# Patient Record
Sex: Female | Born: 1965 | Race: White | Hispanic: No | Marital: Married | Smoking: Former smoker
Health system: Southern US, Community
[De-identification: ages and names within clinical notes are randomized; demographics above are authoritative.]

## PROBLEM LIST (undated history)

## (undated) DIAGNOSIS — G43909 Migraine, unspecified, not intractable, without status migrainosus: Secondary | ICD-10-CM

## (undated) DIAGNOSIS — K635 Polyp of colon: Secondary | ICD-10-CM

## (undated) DIAGNOSIS — C4490 Unspecified malignant neoplasm of skin, unspecified: Secondary | ICD-10-CM

## (undated) DIAGNOSIS — B019 Varicella without complication: Secondary | ICD-10-CM

## (undated) DIAGNOSIS — M1990 Unspecified osteoarthritis, unspecified site: Secondary | ICD-10-CM

## (undated) DIAGNOSIS — N390 Urinary tract infection, site not specified: Secondary | ICD-10-CM

## (undated) DIAGNOSIS — K5792 Diverticulitis of intestine, part unspecified, without perforation or abscess without bleeding: Secondary | ICD-10-CM

## (undated) HISTORY — DX: Urinary tract infection, site not specified: N39.0

## (undated) HISTORY — DX: Unspecified osteoarthritis, unspecified site: M19.90

## (undated) HISTORY — DX: Migraine, unspecified, not intractable, without status migrainosus: G43.909

## (undated) HISTORY — DX: Polyp of colon: K63.5

## (undated) HISTORY — DX: Diverticulitis of intestine, part unspecified, without perforation or abscess without bleeding: K57.92

## (undated) HISTORY — PX: TONSILLECTOMY: SUR1361

## (undated) HISTORY — DX: Varicella without complication: B01.9

## (undated) HISTORY — DX: Unspecified malignant neoplasm of skin, unspecified: C44.90

---
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
## 1998-01-04 ENCOUNTER — Inpatient Hospital Stay (HOSPITAL_COMMUNITY): Admission: AD | Admit: 1998-01-04 | Discharge: 1998-01-08 | Payer: Self-pay | Admitting: Obstetrics and Gynecology

## 2001-07-16 ENCOUNTER — Other Ambulatory Visit: Admission: RE | Admit: 2001-07-16 | Discharge: 2001-07-16 | Payer: Self-pay | Admitting: Obstetrics and Gynecology

## 2002-07-11 ENCOUNTER — Other Ambulatory Visit: Admission: RE | Admit: 2002-07-11 | Discharge: 2002-07-11 | Payer: Self-pay | Admitting: Obstetrics and Gynecology

## 2003-06-13 ENCOUNTER — Ambulatory Visit (HOSPITAL_COMMUNITY): Admission: RE | Admit: 2003-06-13 | Discharge: 2003-06-13 | Payer: Self-pay | Admitting: Internal Medicine

## 2003-08-15 ENCOUNTER — Other Ambulatory Visit: Admission: RE | Admit: 2003-08-15 | Discharge: 2003-08-15 | Payer: Self-pay | Admitting: Obstetrics and Gynecology

## 2003-08-27 ENCOUNTER — Ambulatory Visit (HOSPITAL_COMMUNITY): Admission: RE | Admit: 2003-08-27 | Discharge: 2003-08-27 | Payer: Self-pay | Admitting: Obstetrics and Gynecology

## 2004-10-27 ENCOUNTER — Other Ambulatory Visit: Admission: RE | Admit: 2004-10-27 | Discharge: 2004-10-27 | Payer: Self-pay | Admitting: Obstetrics and Gynecology

## 2005-08-17 ENCOUNTER — Inpatient Hospital Stay (HOSPITAL_COMMUNITY): Admission: RE | Admit: 2005-08-17 | Discharge: 2005-08-20 | Payer: Self-pay | Admitting: Obstetrics and Gynecology

## 2005-08-17 ENCOUNTER — Encounter (INDEPENDENT_AMBULATORY_CARE_PROVIDER_SITE_OTHER): Payer: Self-pay | Admitting: Specialist

## 2005-09-16 ENCOUNTER — Other Ambulatory Visit: Admission: RE | Admit: 2005-09-16 | Discharge: 2005-09-16 | Payer: Self-pay | Admitting: Obstetrics and Gynecology

## 2006-10-02 ENCOUNTER — Emergency Department (HOSPITAL_COMMUNITY): Admission: EM | Admit: 2006-10-02 | Discharge: 2006-10-02 | Payer: Self-pay | Admitting: Emergency Medicine

## 2008-12-27 ENCOUNTER — Encounter: Admission: RE | Admit: 2008-12-27 | Discharge: 2008-12-27 | Payer: Self-pay | Admitting: Internal Medicine

## 2011-02-25 NOTE — Discharge Summary (Signed)
Janet George, [address]               ACCOUNT NO.:  [id number]   MEDICAL RECORD NO.:  [id number]          PATIENT TYPE:  INP   LOCATION:  9145                          FACILITY:  [facility]   PHYSICIAN:  Michelle L. Grewal, M.D.DATE OF BIRTH:  Jun 14, 1966   DATE OF ADMISSION:  08/17/2005  DATE OF DISCHARGE:  08/20/2005                                 DISCHARGE SUMMARY   ADMITTING DIAGNOSIS:  1.  Intrauterine pregnancy at term.  2.  Previous cesarean section, desires repeat.  3.  Multiparity, desires sterility.   DISCHARGE DIAGNOSES:  1.  Status post low transverse cesarean section.  2.  Viable female infant.  3.  Permanent sterilization.   PROCEDURES:  1.  Repeat low transverse cesarean section.  2.  Bilateral tubal ligation.   REASON FOR ADMISSION:  Please see dictated H&P.   HOSPITAL COURSE:  The patient is a 45 year old gravida 3, para 2, married  female that was admitted to [facility] for a scheduled  cesarean section.  The patient had had a previous cesarean with her previous  delivery and desired repeat.  Due to multiparity, the patient also requested  permanent sterilization.  On the morning of admission, the patient was taken  to the operating room where spinal anesthesia was administered without  difficulty.  A low transverse incision was made with delivery of a viable  female infant, weighing 7 pounds 15 ounces, with Apgar's of 9 at 1 minute and  9 at 5 minutes. The patient tolerated the procedure well and was taken to  the recovery room in stable condition.  On postoperative day #1, the patient was without complaint.  Vital signs  stable.  She was afebrile.  Abdomen soft with good return of bowel function.  Fundus was firm and nontender.  Abdominal dressing was noted to have a small  amount of bright red bleeding noted on bandage.  The bandage was partially  removed with small oozing noted left of midline.  A Foley had been  discontinued and the patient was  voiding adequately.  Laboratory findings  revealed hemoglobin of 11.0, platelet count of 158,000, WBC count of 11.8.  On postoperative day #2, the patient was without complaint.  Vital signs  remained stable.  Fundus was firm and nontender.  Abdominal dressing had  been removed revealing an incision that was now clean, dry, and intact.  The  patient was ambulating well, tolerating a regular diet without complaints of  nausea vomiting.  On postoperative day #3, the patient was without complaint.  Vital signs  remained stable.  Fundus firm and nontender.  Incision was clean, dry, and  intact, staples removed.  The patient was discharged home   CONDITION ON DISCHARGE:  Good.   DIET:  Regular as tolerated.   ACTIVITY:  No heavy lifting, no driving x2 weeks, no vaginal entry.   FOLLOWUP:  Patient to follow up in the office in 1-2 weeks for incision  check.  She is to call for temperature greater than 100 degrees, persistent  nausea, vomiting, heavy vaginal bleeding, and/or redness or drainage from  incisional  site.   DISCHARGE MEDICATIONS:  1.  Tylox, #30, one p.o. every four to six hours p.r.n.  2.  Motrin 600 mg every 6 hours.  3.  Prenatal vitamins one p.o. daily.  4.  Colace one p.o. daily p.r.n.      Julio Sicks, N.P.      Stann Mainland. Vincente Poli, M.D.  Electronically Signed    CC/MEDQ  D:  09/20/2005  T:  09/20/2005  Job:  [id number]

## 2011-02-25 NOTE — Op Note (Signed)
NAME:  Janet George, Janet George                           ACCOUNT NO.:  [id number]   MEDICAL RECORD NO.:  [id number]                   PATIENT TYPE:  AMB   LOCATION:  SDC                                  [facility]ILITY:  [facility]   PHYSICIAN:  Juluis Mire, M.D.                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   OPERATIVE PROCEDURE:  Laser ablation of condyloma.   SURGEON:  Juluis Mire, M.D.   ANESTHESIA:  General endotracheal.   ESTIMATED BLOOD LOSS:  Minimal.   PACKS AND DRAINS:  None.   INTRAOPERATIVE BLOOD REPLACED:  None.   COMPLICATIONS:  None.   INDICATIONS:  Noted in the history and physical.   The procedure was as follows:  The patient was taken to the OR, placed in  the supine position.  After a satisfactory level of general anesthesia  obtained, the patient was placed in the dorsal lithotomy position using the  Allen stirrups.  The perineum was cleansed with vinegar.  The CO2 laser and  magnification device were brought into place.  Using the CO2 laser at 15  watts continuous mode, areas of active vulvar condyloma were ablated.  We  then defocused the laser and blanched the skin around the lesions, hopefully  to prevent recurrences.  We applied Xylocaine jelly for comfort.  The  patient was taken out of the dorsal lithotomy position and once alert and  extubated, transferred to the recovery room in good condition.  Sponge,  instrument, and needle count reported as correct by the circulating nurse.                                               Juluis Mire, M.D.    JSM/MEDQ  D:  08/27/2003  T:  08/27/2003  Job:  [id number]

## 2011-02-25 NOTE — H&P (Signed)
NAME:  Janet George, Janet George NO.:  [id number]   MEDICAL RECORD NO.:  [id number]          PATIENT TYPE:  INP   LOCATION:  NA                            [facility]   PHYSICIAN:  Juluis Mire, M.D.   DATE OF BIRTH:  [birth year]   DATE OF ADMISSION:  08/17/2005  DATE OF DISCHARGE:                                HISTORY & PHYSICAL   HISTORY OF PRESENT ILLNESS:  The patient is a 45 year old, gravida 3, para 2  married female, estimated date of confinement of August 25, 2005; this  gives her an estimated gestational age of [redacted] weeks.  This is consistent with  initial examination and early ultrasounds.  Her prenatal course has been  complicated by advanced maternal age.  She declined amniocentesis.  She did  undergo a first trimester screening with the [employer] of  [place].  This was negative.  Follow up second trimester ultrasound was  also normal.  She has had 2 prior cesarean sections.  She now presents for  repeat cesarean section, and desires permanent sterilization, and bilateral  tubal ligation will be performed.   ALLERGIES:  No known drug allergies.   MEDICATIONS:  Prenatal vitamins.   PAST MEDICAL HISTORY/FAMILY HISTORY/SOCIAL HISTORY:  Please see prenatal  records.   REVIEW OF SYSTEMS:  Noncontributory.   PHYSICAL EXAMINATION:  VITAL SIGNS:  The patient is afebrile with stable  vital signs.  HEENT:  The patient is normocephalic.  Pupils equal, round and reactive to  light and accommodation.  Extraocular movements were intact.  Sclerae and  conjunctivae clear.  Oropharynx clear.  NECK:  Without thyromegaly.  BREASTS:  Glandular, but no discrete masses.  LUNGS:  Clear.  CARDIOVASCULAR:  Regular rhythm and rate with a grade 2/6 systolic ejection  murmur.  No clicks or gallops.  ABDOMEN:  Gravid uterus consistent with dates.  Well-healed low transverse  incision.  PELVIC:  Deferred.  EXTREMITIES:  Trace edema.  NEUROLOGIC:  Grossly within  normal limits.  Deep tendon reflexes were 2+  without clonus.   IMPRESSION:  1.  Intrauterine pregnancy at term with prior cesarean section for repeat.  2.  Multiparity; desires sterility.   PLAN:  The patient will undergo repeat cesarean section with bilateral tubal  ligation.  The risks of surgery have been discussed including the risks of  infection, the risk of hemorrhage that could require transfusion, with the  risk of AIDS or hepatitis, the risk of injury to adjacent organs involving  bladder, bowel, or ureters that could require further exploratory surgery,  risk of deep vein thrombosis and pulmonary embolus.  In terms of the tubal,  alternative forms  of birth control have been discussed.  The potential irreversibility of  sterilization was explained.  A failure rate of 1 in 200 was quoted.  Failures can be in the form of ectopic pregnancy requiring further surgical  management.  The patient does understand indications and risks.      Juluis Mire, M.D.  Electronically Signed     JSM/MEDQ  D:  08/17/2005  T:  08/17/2005  Job:  [id number]

## 2011-02-25 NOTE — H&P (Signed)
NAME:  Janet George, Janet George                           ACCOUNT NO.:  [id number]   MEDICAL RECORD NO.:  [id number]                   PATIENT TYPE:  AMB   LOCATION:  SDC                                  [facility]   PHYSICIAN:  Juluis Mire, M.D.                DATE OF BIRTH:  [birth year]   DATE OF ADMISSION:  08/27/2003  DATE OF DISCHARGE:                                HISTORY & PHYSICAL   BRIEF HISTORY:  Patient a 45 year old gravida 2, para 2 white female  presents for laser ablation of all of her HPV.   In relation to the present admission, for the past month, we have been  treating a very resistant case of vulvar HPV.  We have tried topical  trichloroacetic acid.  We have tried Aldara at home and cryotherapy in the  office, despite this she has progressive and persistent HPV.  We are going  to proceed with laser ablation of the vulvar condyloma.   ALLERGIES:  She has no known drug allergies.   MEDICATIONS:  Medications include birth control pills.   PAST MEDICAL HISTORY:  Usual childhood diseases without any significant  sequela.   PREVIOUS OBSTETRICAL HISTORY:  She has had two prior cesarean sections.   OTHER SURGICAL HISTORY:  She has had one previous diagnostic laparoscopy.   FAMILY HISTORY:  Noncontributory.   SOCIAL HISTORY:  No tobacco or alcohol.   REVIEW OF SYSTEMS:  Noncontributory.   PHYSICAL EXAMINATION:  VITAL SIGNS:  Patient is afebrile with stable vital  signs.  HEENT:  Patient normocephalic.  Pupils equal, round and reactive to light  and accommodation; extraocular movements are intact; sclerae and  conjunctivae are clear.  Oropharynx clear.  NECK:  Without thyromegaly.  BREASTS:  No discrete masses.  LUNGS:  Clear.  CARDIOVASCULAR SYSTEM:  Regular rhythm and rate.  No murmurs or gallops.  ABDOMEN:  Exam is benign.  No mass, organomegaly, or tenderness.  PELVIC:  Active vulvar HPV are noted on the perineal body, perirectal area,  periclitoral area.   Vaginal mucosa clear.  Cervix unremarkable.  Uterus  normal size, shape, and contour.  Adnexa free of masses or tenderness.  EXTREMITIES:  Trace edema.  NEUROLOGICAL:  Exam is grossly within normal limits.   IMPRESSION:  Resistant vulvar human papilloma virus.   PLAN:  Patient to undergo laser ablation of all of her HPV.  The risks of  surgery have been discussed including the risk of infection.  The risk of  bleeding.  Risk of injury to adjacent organs.  Risk of deep venous  thrombosis or pulmonary embolus.  Patient expressed understanding of  indication and risks.  Juluis Mire, M.D.    JSM/MEDQ  D:  08/27/2003  T:  08/27/2003  Job:  [id number]

## 2011-02-25 NOTE — Op Note (Signed)
Janet George, [place]               ACCOUNT NO.:  [id number]   MEDICAL RECORD NO.:  [id number]          PATIENT TYPE:  INP   LOCATION:  9145                          [facility]   PHYSICIAN:  Juluis Mire, M.D.   DATE OF BIRTH:  08-Sep-1966   DATE OF PROCEDURE:  08/17/2005  DATE OF DISCHARGE:                                 OPERATIVE REPORT   PREOPERATIVE DIAGNOSES:  1.  Intrauterine pregnancy at term with prior cesarean section for repeat.      2. Multiparity, desires sterility.   POSTOPERATIVE DIAGNOSES:  1.  Intrauterine pregnancy at term with prior cesarean section for repeat.      2. Multiparity, desires sterility.   OPERATIVE PROCEDURE:  Low transverse cesarean section with bilateral tubal  ligation.   SURGEON:  Juluis Mire, M.D.   ANESTHESIA:  Spinal.   ESTIMATED BLOOD LOSS:  500 mL.   PACKS AND DRAINS:  None.   INTRAOPERATIVE BLOOD REPLACEMENT:  None.   COMPLICATIONS:  None.   INDICATION:  As dictated in History and Physical.   DESCRIPTION OF PROCEDURE:  The patient was taken to the operating room and  placed in the supine position with left lateral tilt.  After a satisfactory  level of spinal anesthesia was obtained, the low transverse incision that  was previously made was excised.  The incision was then extended through the  subcutaneous tissue.  The fascia was identified and entered sharply and the  incision in the fascia was extended laterally.  The fascia was then taken  off the muscles superiorly and inferiorly.  Rectus muscles were separated in  the midline.  Perineum was entered sharply and the incision in the perineum  was extended both superiorly and inferiorly.  A low transverse bladder flap  was developed.  A low transverse uterine incision was begun with a knife and  extended laterally using manual traction.  Amniotic fluid was clear.  The  infant was delivered with fundal pressure and the vacuum extractor.  The  infant was a viable female  weighing 7 pounds 15 ounces, Apgars 9/9.  Umbilical  cord pH is still pending.  The placenta was delivered.  Uterus was then  exteriorized for closure.  The uterus was closed with interrupted suture of  0 chromic using two-layer closure technique with excellent hemostasis.  Urine output remained clear and adequate.   Both tubes were identified and elevated with the Babcock tenaculum.  A hole  was made in the avascular mesosalpinx.  Individual sutures of 0 plain catgut  were used to ligate off the segment of tube.  The intervening segment of  tube was then excised.  The cut ends of the tube were cauterized using the  Bovie.  We had good hemostasis bilaterally.  Both tubes were adequately  transected.  Ovaries appeared normal.  Uterus was returned to the abdominal  cavity.  Abdomen was thoroughly irrigated.  We had good hemostasis and clear  urine output.  Muscles were reapproximated with a running suture of 3-0  Vicryl.  Fascia was closed with a running suture of 0  PDS.  Skin was closed with staples and Steri-Strips.  Sponge, instrument,  and needle count were reported as correct by the circulating nurse x2.  Foley catheter remained clear at the time of closure.  The patient tolerated  the procedure well and was returned to the recovery room in good condition.      Juluis Mire, M.D.  Electronically Signed     JSM/MEDQ  D:  08/17/2005  T:  08/17/2005  Job:  [id number]

## 2013-07-15 ENCOUNTER — Other Ambulatory Visit: Payer: Self-pay | Admitting: Gastroenterology

## 2015-04-14 ENCOUNTER — Other Ambulatory Visit: Payer: Self-pay | Admitting: Obstetrics and Gynecology

## 2015-04-15 LAB — CYTOLOGY - PAP

## 2016-01-28 ENCOUNTER — Emergency Department (HOSPITAL_BASED_OUTPATIENT_CLINIC_OR_DEPARTMENT_OTHER): Payer: BLUE CROSS/BLUE SHIELD

## 2016-01-28 ENCOUNTER — Emergency Department (HOSPITAL_BASED_OUTPATIENT_CLINIC_OR_DEPARTMENT_OTHER)
Admission: EM | Admit: 2016-01-28 | Discharge: 2016-01-28 | Disposition: A | Discharge status: Home / Self Care | Payer: BLUE CROSS/BLUE SHIELD | Attending: Emergency Medicine | Admitting: Emergency Medicine

## 2016-01-28 ENCOUNTER — Encounter (HOSPITAL_BASED_OUTPATIENT_CLINIC_OR_DEPARTMENT_OTHER): Payer: Self-pay | Admitting: *Deleted

## 2016-01-28 DIAGNOSIS — R079 Chest pain, unspecified: Secondary | ICD-10-CM | POA: Insufficient documentation

## 2016-01-28 DIAGNOSIS — R197 Diarrhea, unspecified: Secondary | ICD-10-CM | POA: Diagnosis not present

## 2016-01-28 DIAGNOSIS — M549 Dorsalgia, unspecified: Secondary | ICD-10-CM | POA: Diagnosis not present

## 2016-01-28 DIAGNOSIS — R42 Dizziness and giddiness: Secondary | ICD-10-CM | POA: Diagnosis not present

## 2016-01-28 DIAGNOSIS — R110 Nausea: Secondary | ICD-10-CM | POA: Insufficient documentation

## 2016-01-28 DIAGNOSIS — R0602 Shortness of breath: Secondary | ICD-10-CM | POA: Insufficient documentation

## 2016-01-28 LAB — COMPREHENSIVE METABOLIC PANEL
ALBUMIN: 4.1 g/dL (ref 3.5–5.0)
ALT: 15 U/L (ref 14–54)
AST: 19 U/L (ref 15–41)
Alkaline Phosphatase: 73 U/L (ref 38–126)
Anion gap: 7 (ref 5–15)
BUN: 24 mg/dL — AB (ref 6–20)
CHLORIDE: 102 mmol/L (ref 101–111)
CO2: 27 mmol/L (ref 22–32)
CREATININE: 0.77 mg/dL (ref 0.44–1.00)
Calcium: 9.2 mg/dL (ref 8.9–10.3)
GFR calc Af Amer: >60 mL/min (ref >60)
GFR calc non Af Amer: >60 mL/min (ref >60)
GLUCOSE: 90 mg/dL (ref 65–99)
POTASSIUM: 3.7 mmol/L (ref 3.5–5.1)
SODIUM: 136 mmol/L (ref 135–145)
Total Bilirubin: 0.6 mg/dL (ref 0.3–1.2)
Total Protein: 7.4 g/dL (ref 6.5–8.1)

## 2016-01-28 LAB — CBC WITH DIFFERENTIAL/PLATELET
Basophils Absolute: 0.1 10*3/uL (ref 0.0–0.1)
Basophils Relative: 1 %
EOS ABS: 0.2 10*3/uL (ref 0.0–0.7)
EOS PCT: 3 %
HCT: 38.8 % (ref 36.0–46.0)
Hemoglobin: 12.8 g/dL (ref 12.0–15.0)
LYMPHS ABS: 2.6 10*3/uL (ref 0.7–4.0)
LYMPHS PCT: 33 %
MCH: 29.6 pg (ref 26.0–34.0)
MCHC: 33 g/dL (ref 30.0–36.0)
MCV: 89.8 fL (ref 78.0–100.0)
MONOS PCT: 11 %
Monocytes Absolute: 0.9 10*3/uL (ref 0.1–1.0)
Neutro Abs: 4.2 10*3/uL (ref 1.7–7.7)
Neutrophils Relative %: 52 %
PLATELETS: 266 10*3/uL (ref 150–400)
RBC: 4.32 MIL/uL (ref 3.87–5.11)
RDW: 13.7 % (ref 11.5–15.5)
WBC: 8 10*3/uL (ref 4.0–10.5)

## 2016-01-28 LAB — TROPONIN I
Troponin I: <0.03 ng/mL (ref <0.031)
Troponin I: <0.03 ng/mL (ref <0.031)

## 2016-01-28 MED ORDER — SODIUM CHLORIDE 0.9 % IV SOLN
INTRAVENOUS | Status: DC
  Administered 2016-01-28: 750 mL via INTRAVENOUS

## 2016-01-28 MED ORDER — NITROGLYCERIN 0.4 MG SL SUBL
0.4000 mg | SUBLINGUAL_TABLET | SUBLINGUAL | Status: DC | PRN
  Administered 2016-01-28 (×3): 0.4 mg via SUBLINGUAL
  Filled 2016-01-28: qty 1

## 2016-01-28 MED ORDER — SODIUM CHLORIDE 0.9 % IV BOLUS (SEPSIS)
250.0000 mL | Freq: Once | INTRAVENOUS | Status: AC
  Administered 2016-01-28: 250 mL via INTRAVENOUS

## 2016-01-28 NOTE — ED Notes (Signed)
Chest pain in her upper chest x 2 weeks. Sharp pain and lightheaded while driving home today. Right arm numbness.

## 2016-01-28 NOTE — ED Provider Notes (Addendum)
CSN: [id number]     Arrival date & time 01/28/16  1842 History   By signing my name below, I, Janet George, attest that this documentation has been prepared under the direction and in the presence of Fredia Sorrow, MD.  Electronically Signed: Forrestine George, ED Scribe. 01/28/2016. 7:43 PM.   Chief Complaint  Patient presents with  . Chest Pain   HPI  HPI Comments: Janet George is a 50 y.o. female without any pertinent past medical history who presents to the Emergency Department complaining of intermittent, ongoing chest pain x 2 weeks; worsened today with radiating pain to the back. Episodes last for a few hours when they come; Today's episode initially started at 2:00 PM and is still ongoing. Pt described pain as achy and intermittently sharp. Currently discomfort is rated 5/10. She also reports mild back pain, shortness of breath, lightheadedness, and nausea. No aggravating or alleviating factors attempted prior to arrival. Full dose of ASA attempted at home prior to arrival. No recent fever, chills, cough, vomiting, or abdominal pain. She denies any prior history of same. No family history of heart disease. No known allergies to medications.  PCP: Helane Rima, MD    History reviewed. No pertinent past medical history. Past Surgical History  Procedure Laterality Date  . Tonsillectomy    . Cesarean section     No family history on file. Social History  Substance Use Topics  . Smoking status: Never Smoker   . Smokeless tobacco: None  . Alcohol Use: Yes     Comment: weekends    OB History    No data available     Review of Systems  Constitutional: Negative for fever and chills.  HENT: Negative for rhinorrhea and sore throat.   Eyes: Negative for visual disturbance.  Respiratory: Positive for shortness of breath. Negative for cough.   Cardiovascular: Positive for chest pain. Negative for leg swelling.  Gastrointestinal: Positive for nausea and diarrhea. Negative for  vomiting and abdominal pain.  Genitourinary: Negative for dysuria.  Musculoskeletal: Positive for back pain. Negative for neck pain.  Skin: Negative for rash.  Neurological: Positive for light-headedness. Negative for dizziness and headaches.  Hematological: Does not bruise/bleed easily.  Psychiatric/Behavioral: Negative for confusion.      Allergies  Review of patient's allergies indicates no known allergies.  Home Medications   Prior to Admission medications   Not on File   Triage Vitals: BP 104/71 mmHg  Pulse 68  Temp(Src) 98.3 F (36.8 C) (Oral)  Resp 13  Ht 5\' 5"  (1.651 m)  Wt 83.915 kg  BMI 30.79 kg/m2  SpO2 100%   Physical Exam  Constitutional: She is oriented to person, place, and time. She appears well-developed and well-nourished. No distress.  HENT:  Head: Normocephalic and atraumatic.  Mouth/Throat: Oropharynx is clear and moist. No oropharyngeal exudate.  Eyes: EOM are normal. Pupils are equal, round, and reactive to light.  Neck: Normal range of motion.  Cardiovascular: Normal rate, regular rhythm and normal heart sounds.   Capillary refill to both toes are 1 second  Pulmonary/Chest: Effort normal and breath sounds normal.  Abdominal: Soft. She exhibits no distension. There is no tenderness.  Musculoskeletal: Normal range of motion. She exhibits no edema.  Neurological: She is alert and oriented to person, place, and time. No cranial nerve deficit. She exhibits normal muscle tone. Coordination normal.  Skin: Skin is warm and dry.  Psychiatric: She has a normal mood and affect. Judgment normal.  Nursing note and  vitals reviewed.   ED Course  Procedures (including critical care time)  DIAGNOSTIC STUDIES: Oxygen Saturation is 100% on RA, Normal by my interpretation.    COORDINATION OF CARE: 7:26 PM- Will order CXR, blood work, and EKG. Will give Nitro and fluids. Discussed treatment plan with pt at bedside and pt agreed to plan.     Labs Review Labs  Reviewed  COMPREHENSIVE METABOLIC PANEL - Abnormal; Notable for the following:    BUN 24 (*)    All other components within normal limits  CBC WITH DIFFERENTIAL/PLATELET  TROPONIN I  TROPONIN I   Results for orders placed or performed during the hospital encounter of 01/28/16  Comprehensive metabolic panel  Result Value Ref Range   Sodium 136 135 - 145 mmol/L   Potassium 3.7 3.5 - 5.1 mmol/L   Chloride 102 101 - 111 mmol/L   CO2 27 22 - 32 mmol/L   Glucose, Bld 90 65 - 99 mg/dL   BUN 24 (H) 6 - 20 mg/dL   Creatinine, Ser 0.77 0.44 - 1.00 mg/dL   Calcium 9.2 8.9 - 10.3 mg/dL   Total Protein 7.4 6.5 - 8.1 g/dL   Albumin 4.1 3.5 - 5.0 g/dL   AST 19 15 - 41 U/L   ALT 15 14 - 54 U/L   Alkaline Phosphatase 73 38 - 126 U/L   Total Bilirubin 0.6 0.3 - 1.2 mg/dL   GFR calc non Af Amer >60 >60 mL/min   GFR calc Af Amer >60 >60 mL/min   Anion gap 7 5 - 15  CBC with Differential/Platelet  Result Value Ref Range   WBC 8.0 4.0 - 10.5 K/uL   RBC 4.32 3.87 - 5.11 MIL/uL   Hemoglobin 12.8 12.0 - 15.0 g/dL   HCT 38.8 36.0 - 46.0 %   MCV 89.8 78.0 - 100.0 fL   MCH 29.6 26.0 - 34.0 pg   MCHC 33.0 30.0 - 36.0 g/dL   RDW 13.7 11.5 - 15.5 %   Platelets 266 150 - 400 K/uL   Neutrophils Relative % 52 %   Neutro Abs 4.2 1.7 - 7.7 K/uL   Lymphocytes Relative 33 %   Lymphs Abs 2.6 0.7 - 4.0 K/uL   Monocytes Relative 11 %   Monocytes Absolute 0.9 0.1 - 1.0 K/uL   Eosinophils Relative 3 %   Eosinophils Absolute 0.2 0.0 - 0.7 K/uL   Basophils Relative 1 %   Basophils Absolute 0.1 0.0 - 0.1 K/uL  Troponin I  Result Value Ref Range   Troponin I <0.03 <0.031 ng/mL  Troponin I  Result Value Ref Range   Troponin I <0.03 <0.031 ng/mL     Imaging Review Dg Chest 2 View  01/28/2016  CLINICAL DATA:  Intermittent midsternal chest pain for 2 weeks. EXAM: CHEST  2 VIEW COMPARISON:  None. FINDINGS: The cardiomediastinal contours are normal. The lungs are clear. Pulmonary vasculature is normal. No  consolidation, pleural effusion, or pneumothorax. No acute osseous abnormalities are seen. IMPRESSION: No acute pulmonary process. Electronically Signed   By: Jeb Levering M.D.   On: 01/28/2016 20:39   I have personally reviewed and evaluated these images and lab results as part of my medical decision-making.   EKG Interpretation   Date/Time:  Thursday January 28 2016 18:46:34 EDT Ventricular Rate:  67 PR Interval:  96 QRS Duration: 112 QT Interval:  442 QTC Calculation: 467 R Axis:   92 Text Interpretation:  Sinus rhythm with short PR Incomplete right bundle  branch block Possible Right ventricular hypertrophy Abnormal ECG No  previous ECGs available Confirmed by Kataleyah Carducci  MD, Leona Pressly [phone]) on  01/28/2016 7:19:08 PM      MDM   Final diagnoses:  Chest pain, unspecified chest pain type    Patient with extensive workup for the chest pain troponins negative 2. Based on the fact that she's had chest pain on and off for 2 weeks although today it did last longer than usual unlikely that this was an acute cardiac event. Chest x-rays also negative for pneumonia pneumothorax or pulmonary edema. Patient's oxygen saturations are in the high 90s. Do not have any concern for pulmonary embolus. Will start patient on baby aspirin a day and have her follow-up with cardiology. Recommend starting a baby aspirin a day  I personally performed the services described in this documentation, which was scribed in my presence. The recorded information has been reviewed and is accurate.     Fredia Sorrow, MD 01/28/16 [place], MD 01/28/16 2300

## 2016-01-28 NOTE — ED Notes (Signed)
C/o mid sternal cp off and on x 2 week  Sharp at times and dull at times,  When pain is sharp has some nausea,  Also states had to take deep breath at times

## 2016-01-28 NOTE — Discharge Instructions (Signed)
Aspirin and Your Heart  Aspirin is a medicine that affects the way blood clots. Aspirin can be used to help reduce the risk of blood clots, heart attacks, and other heart-related problems.  SHOULD I TAKE ASPIRIN? Your health care provider will help you determine whether it is safe and beneficial for you to take aspirin daily. Taking aspirin daily may be beneficial if you:  Have had a heart attack or chest pain.  Have undergone open heart surgery such as coronary artery bypass surgery (CABG).  Have had coronary angioplasty.  Have experienced a stroke or transient ischemic attack (TIA).  Have peripheral vascular disease (PVD).  Have chronic heart rhythm problems such as atrial fibrillation. ARE THERE ANY RISKS OF TAKING ASPIRIN DAILY? Daily use of aspirin can increase your risk of side effects. Some of these include:  Bleeding. Bleeding problems can be minor or serious. An example of a minor problem is a cut that does not stop bleeding. An example of a more serious problem is stomach bleeding or bleeding into the brain. Your risk of bleeding is increased if you are also taking non-steroidal anti-inflammatory medicine (NSAIDs).  Increased bruising.  Upset stomach.  An allergic reaction. People who have nasal polyps have an increased risk of developing an aspirin allergy. WHAT ARE SOME GUIDELINES I SHOULD FOLLOW WHEN TAKING ASPIRIN?   Take aspirin only as directed by your health care provider. Make sure you understand how much you should take and what form you should take. The two forms of aspirin are:  Non-enteric-coated. This type of aspirin does not have a coating and is absorbed quickly. Non-enteric-coated aspirin is usually recommended for people with chest pain. This type of aspirin also comes in a chewable form.  Enteric-coated. This type of aspirin has a special coating that releases the medicine very slowly. Enteric-coated aspirin causes less stomach upset than non-enteric-coated  aspirin. This type of aspirin should not be chewed or crushed.  Drink alcohol in moderation. Drinking alcohol increases your risk of bleeding. WHEN SHOULD I SEEK MEDICAL CARE?   You have unusual bleeding or bruising.  You have stomach pain.  You have an allergic reaction. Symptoms of an allergic reaction include:  Hives.  Itchy skin.  Swelling of the lips, tongue, or face.  You have ringing in your ears. WHEN SHOULD I SEEK IMMEDIATE MEDICAL CARE?   Your bowel movements are bloody, dark red, or black in color.  You vomit or cough up blood.  You have blood in your urine.  You cough, wheeze, or feel short of breath. If you have any of the following symptoms, this is an emergency. Do not wait to see if the pain will go away. Get medical help at once. Call your local emergency services (911 in the [place] Do not drive yourself to the hospital.  You have severe chest pain, especially if the pain is crushing or pressure-like and spreads to the arms, back, neck, or jaw.  You have stroke-like symptoms, such as:   Loss of vision.   Difficulty talking.   Numbness or weakness on one side of your body.   Numbness or weakness in your arm or leg.   Not thinking clearly or feeling confused.    This information is not intended to replace advice given to you by your health care provider. Make sure you discuss any questions you have with your health care provider.  Recommend follow-up with cardiology. Return for any new or worse chest pain symptoms. Today's workup was  negative.  Recommend starting a baby aspirin a day.     Document Released: 09/08/2008 Document Revised: 10/17/2014 Document Reviewed: 01/01/2014 Elsevier Interactive Patient Education [employer].

## 2016-02-16 ENCOUNTER — Encounter: Payer: Self-pay | Admitting: Cardiology

## 2016-02-16 ENCOUNTER — Ambulatory Visit (INDEPENDENT_AMBULATORY_CARE_PROVIDER_SITE_OTHER): Payer: BLUE CROSS/BLUE SHIELD | Admitting: Cardiology

## 2016-02-16 VITALS — BP 128/98 | HR 74 | Ht 65.0 in | Wt 201.0 lb

## 2016-02-16 DIAGNOSIS — R079 Chest pain, unspecified: Secondary | ICD-10-CM

## 2016-02-16 NOTE — Patient Instructions (Signed)
Your physician has requested that you have an exercise tolerance test. For further information please visit [email or web address]. Please also follow instruction sheet, as given.  NO CHANGE WITH CURRENT MEDICATIONS   Your physician wants you to follow-up in 1-2 MONTHS WITH DR Ellyn Hack - F/U TEST RESULTS  You will receive a reminder letter in the mail two months in advance. If you don't receive a letter, please call our office to schedule the follow-up appointment.  If you need a refill on your cardiac medications before your next appointment, please call your pharmacy.

## 2016-02-16 NOTE — Progress Notes (Signed)
PCP: Helane Rima, MD  Clinic Note: Chief Complaint  Patient presents with  . New Patient (Initial Visit)    ED f/u for chest pain  . Fatigue  . Tachycardia    HPI: Janet George is a 50 y.o. female with a PMH below who presents today for ER f/u for CP .  Zaelyn Goldsby Dubreuil wasSeen in emergency room on April 20 for off-and-on chest pain going intermittently for 2 weeks. The symptoms usually worsened with some activities but not all. On the day of presentation it worsened and radiated to the back. These episodes last several hours when they come on.  Studies Reviewed: Marissa Calamity   Interval History: ASHTAN PAYNTER describes the time leading up to her emergency room visit as follows: For about 2 weeks she had been noticing some tightness in her chest, diffuse sensation across the upper chest. It seemed to be more the right than left. She notices it went to the right shoulder and was often hard to get her breath. She described the pain as being more sharp and not necessarily increased with exertion but sometimes increase with certain movements. She describes having some chest tightness off and on but not associated with exercise. No exertional dyspnea. She does say that the episodes are usually alleviated with some nitroglycerin shortly after.  She said in general she feels more Than usual has been sleeping more. While she has a history of palpitations in the past, is not having that many episodes are concerning symptoms besides one time on April 30. During that spell, she deathly felt a rapid heartbeat that lasted maybe about a minute. She is maybe had one other episode. This always happens at night.  No chest pain or shortness of breath with rest or exertion. No PND, orthopnea or edema. No palpitations, lightheadedness, dizziness, weakness or syncope/near syncope. No TIA/amaurosis fugax symptoms. No melena, hematochezia, hematuria, or epstaxis. No claudication.  ROS: A comprehensive was  performed. Review of Systems  Constitutional: Negative for weight loss and malaise/fatigue.  HENT: Negative for nosebleeds.   Respiratory: Negative for cough, sputum production and wheezing.   Cardiovascular: Positive for palpitations. Negative for claudication (Borderline).  Gastrointestinal: Negative for blood in stool and melena.  Genitourinary: Negative for hematuria.  Musculoskeletal: Negative for myalgias and neck pain.  Neurological: Positive for dizziness. Negative for headaches.  Endo/Heme/Allergies: Does not bruise/bleed easily.  Psychiatric/Behavioral: Negative for depression and hallucinations.  All other systems reviewed and are negative.    Past Medical History  Diagnosis Date  . Skin cancer     Past Surgical History  Procedure Laterality Date  . Tonsillectomy    . Cesarean section     Prior to Admission medications   Not on File   Not currently taking any medicines No Known Allergies   Social History   Social History  . Marital Status: Married    Spouse Name: N/A  . Number of Children: N/A  . Years of Education: N/A   Social History Main Topics  . Smoking status: Never Smoker   . Smokeless tobacco: None  . Alcohol Use: Yes     Comment: weekends   . Drug Use: No  . Sexual Activity: Not Asked   Other Topics Concern  . None   Social History Narrative   Family History  Problem Relation Age of Onset  . Hyperlipidemia Father   . Hypertension Brother   . Breast cancer Maternal Grandmother   . Hyperlipidemia Paternal Grandmother  Wt Readings from Last 3 Encounters:  02/16/16 201 lb (91.173 kg)  01/28/16 185 lb (83.915 kg)    PHYSICAL EXAM BP 128/98 mmHg  Pulse 74  Ht 5\' 5"  (1.651 m)  Wt 201 lb (91.173 kg)  BMI 33.45 kg/m2 General appearance: alert, cooperative, appears stated age, no distress and Borderline obese, well-groomed Neck: no adenopathy, no carotid bruit and no JVD Lungs: clear to auscultation bilaterally, normal  percussion bilaterally and non-labored Heart: regular rate and rhythm, S1 normal With split S2, no murmur, click, rub or gallop ; nondisplaced PMI Abdomen: soft, non-tender; bowel sounds normal; no masses,  no organomegaly;  Extremities: extremities normal, atraumatic, no cyanosis, or edema  Pulses: 2+ and symmetric;  Skin: mobility and turgor normal  Neurologic: Mental status: Alert, oriented, thought content appropriate Cranial nerves: normal (II-XII grossly intact)    Adult ECG Report Not checked   Other studies Reviewed: Additional studies/ records that were reviewed today include:  Recent Labs:  No labs provided other than from emergency visit No results found for: CHOL, HDL, LDLCALC, LDLDIRECT, TRIG, CHOLHDL     ASSESSMENT / PLAN: Problem List Items Addressed This Visit    Chest pain with low risk for cardiac etiology - Primary    Her symptoms are mostly atypical, however cannot exclude cardiac etiology. Not necessarily associated with exertion, associated with some activity and this is very unlikely to be strictly cardiac.  Since she is otherwise healthy and she has a incomplete right bundle on EKG that should not hinder it interpretation of a GXT EKG test  PLAN: GXT (standard treadmill exercise test)      Relevant Orders   Exercise Tolerance Test      Current medicines are reviewed at length with the patient today. (+/- concerns) Does she need to start any The following changes have been made:none   1-2 weeks  Studies Ordered:   Orders Placed This Encounter  Procedures  . Exercise Tolerance Test      Glenetta Hew, M.D., M.S. Interventional Cardiologist   Pager # [phone] Phone # [phone] [address] [place], [place] [address]

## 2016-02-19 ENCOUNTER — Encounter: Payer: Self-pay | Admitting: Cardiology

## 2016-02-19 DIAGNOSIS — R079 Chest pain, unspecified: Secondary | ICD-10-CM | POA: Insufficient documentation

## 2016-02-19 HISTORY — DX: Chest pain, unspecified: R07.9

## 2016-02-19 NOTE — Assessment & Plan Note (Signed)
Her symptoms are mostly atypical, however cannot exclude cardiac etiology. Not necessarily associated with exertion, associated with some activity and this is very unlikely to be strictly cardiac.  Since she is otherwise healthy and she has a incomplete right bundle on EKG that should not hinder it interpretation of a GXT EKG test  PLAN: GXT (standard treadmill exercise test)

## 2016-03-02 ENCOUNTER — Telehealth (HOSPITAL_COMMUNITY): Payer: Self-pay

## 2016-03-02 NOTE — Telephone Encounter (Signed)
Encounter complete. 

## 2016-03-04 ENCOUNTER — Ambulatory Visit (HOSPITAL_COMMUNITY)
Admission: RE | Admit: 2016-03-04 | Discharge: 2016-03-04 | Disposition: A | Discharge status: Home / Self Care | Payer: BLUE CROSS/BLUE SHIELD | Source: Ambulatory Visit | Attending: Urology | Admitting: Urology

## 2016-03-04 DIAGNOSIS — R079 Chest pain, unspecified: Secondary | ICD-10-CM | POA: Insufficient documentation

## 2016-03-04 LAB — EXERCISE TOLERANCE TEST
CHL CUP RESTING HR STRESS: 104 {beats}/min
CSEPED: 9 min
Estimated workload: 10.1 METS
MPHR: 169 {beats}/min
Peak HR: 179 {beats}/min
Percent HR: 105 %
RPE: 15

## 2016-03-28 ENCOUNTER — Other Ambulatory Visit: Payer: Self-pay | Admitting: Gastroenterology

## 2016-04-25 ENCOUNTER — Ambulatory Visit: Payer: BLUE CROSS/BLUE SHIELD | Admitting: Cardiology

## 2017-01-04 DIAGNOSIS — R09A2 Foreign body sensation, throat: Secondary | ICD-10-CM

## 2017-01-04 DIAGNOSIS — R490 Dysphonia: Secondary | ICD-10-CM | POA: Insufficient documentation

## 2017-01-04 DIAGNOSIS — R519 Headache, unspecified: Secondary | ICD-10-CM | POA: Insufficient documentation

## 2017-01-04 HISTORY — DX: Foreign body sensation, throat: R09.A2

## 2017-03-01 DIAGNOSIS — K2270 Barrett's esophagus without dysplasia: Secondary | ICD-10-CM | POA: Insufficient documentation

## 2017-09-29 IMAGING — CR DG CHEST 2V
2 series · 2 of 2 positions shown · non-contrast
Comparison: None.

CLINICAL DATA: Intermittent midsternal chest pain for 2 weeks.

EXAM:
CHEST  2 VIEW

[w chest pa]
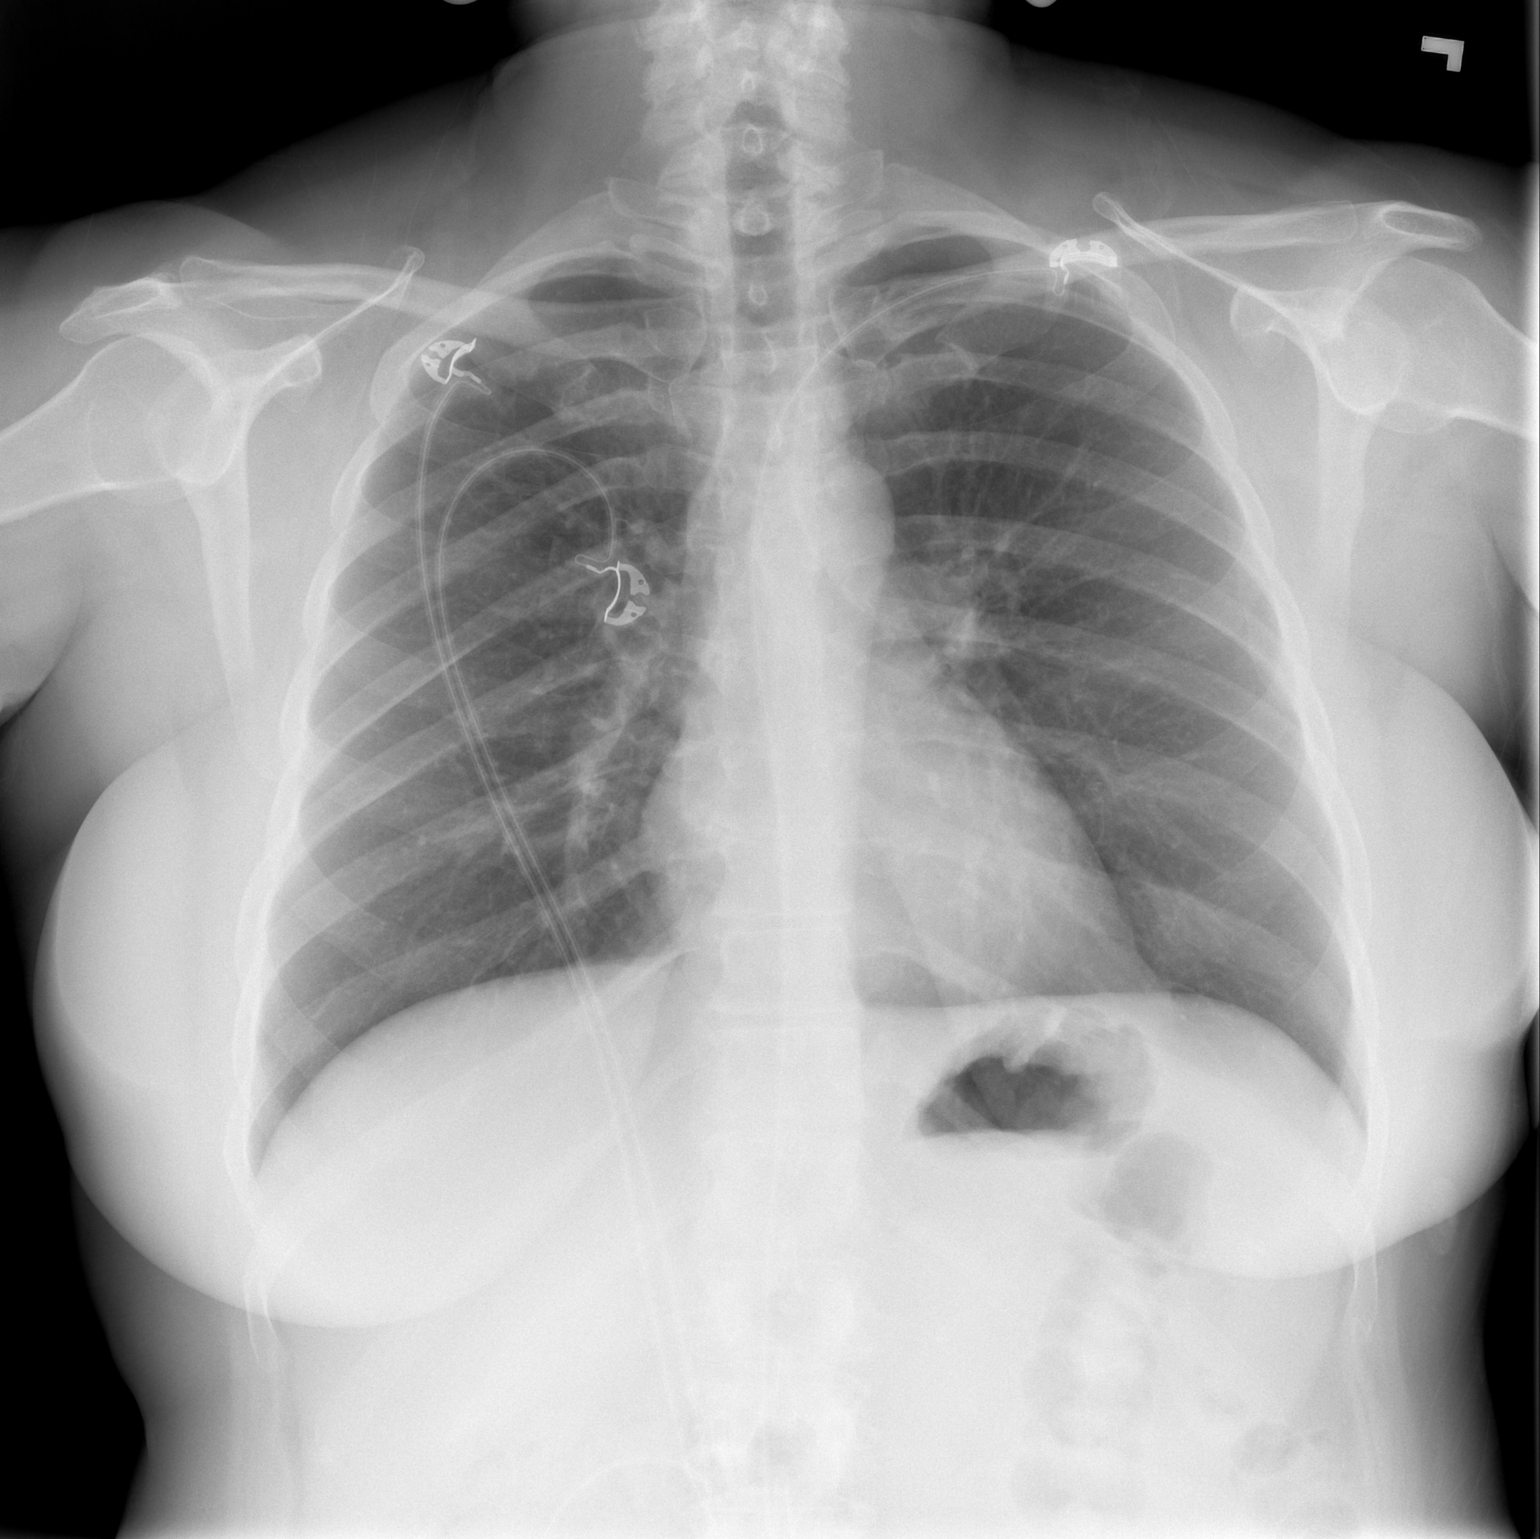

[w chest lat]
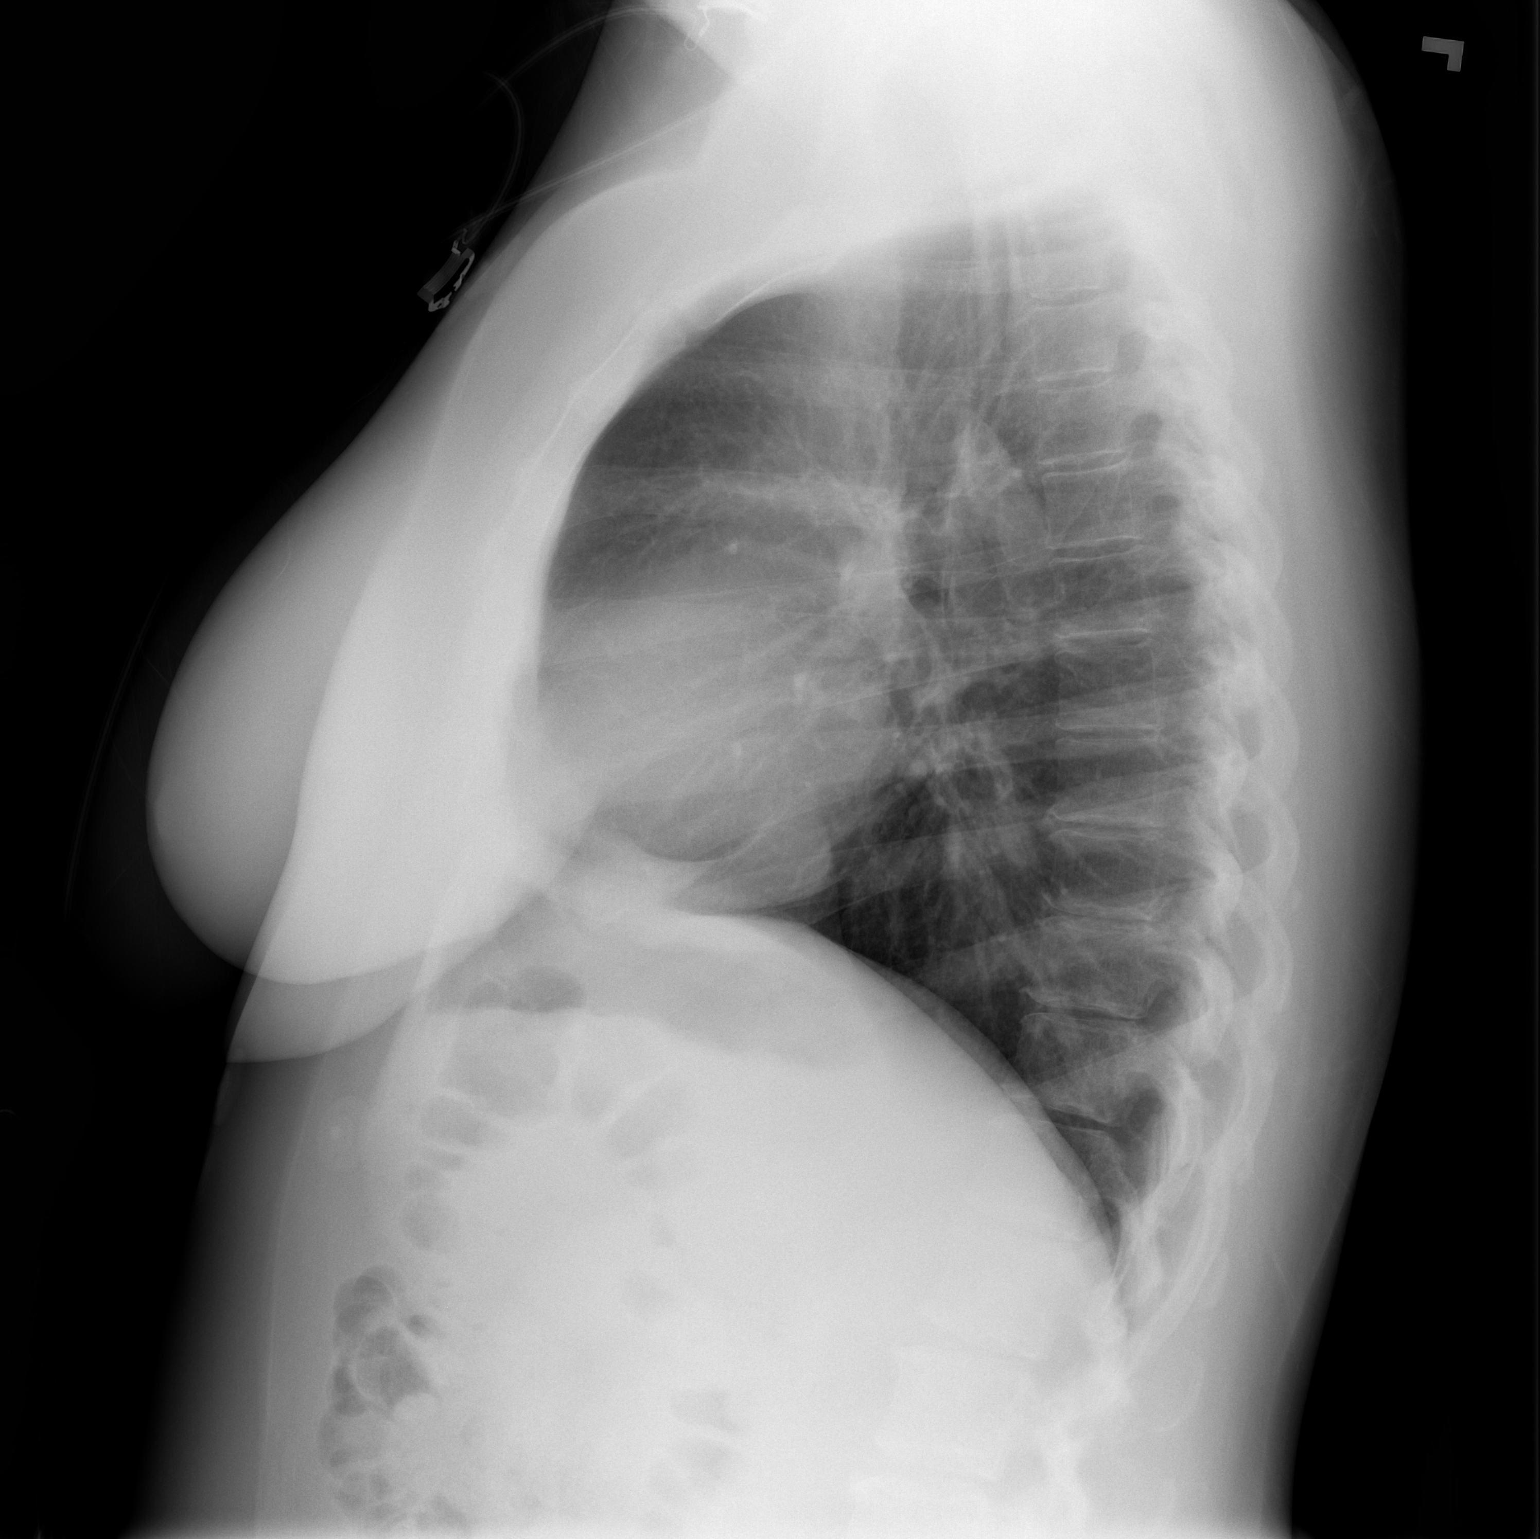

[2 of 2 positions shown; findings below may reference images not displayed]

FINDINGS: The cardiomediastinal contours are normal. The lungs are clear.
Pulmonary vasculature is normal. No consolidation, pleural effusion,
or pneumothorax. No acute osseous abnormalities are seen.
IMPRESSION: No acute pulmonary process.

## 2021-01-30 DIAGNOSIS — H1189 Other specified disorders of conjunctiva: Secondary | ICD-10-CM | POA: Diagnosis not present

## 2021-09-13 DIAGNOSIS — K648 Other hemorrhoids: Secondary | ICD-10-CM | POA: Diagnosis not present

## 2021-09-13 DIAGNOSIS — D125 Benign neoplasm of sigmoid colon: Secondary | ICD-10-CM | POA: Diagnosis not present

## 2021-09-13 DIAGNOSIS — Z86010 Personal history of colonic polyps: Secondary | ICD-10-CM | POA: Diagnosis not present

## 2021-09-13 DIAGNOSIS — K5730 Diverticulosis of large intestine without perforation or abscess without bleeding: Secondary | ICD-10-CM | POA: Diagnosis not present

## 2021-09-13 LAB — HM COLONOSCOPY

## 2021-09-20 DIAGNOSIS — N6459 Other signs and symptoms in breast: Secondary | ICD-10-CM | POA: Diagnosis not present

## 2021-09-21 ENCOUNTER — Other Ambulatory Visit: Payer: Self-pay | Admitting: Obstetrics and Gynecology

## 2021-09-21 DIAGNOSIS — N6320 Unspecified lump in the left breast, unspecified quadrant: Secondary | ICD-10-CM

## 2021-09-21 DIAGNOSIS — N644 Mastodynia: Secondary | ICD-10-CM

## 2021-10-12 DIAGNOSIS — Z03818 Encounter for observation for suspected exposure to other biological agents ruled out: Secondary | ICD-10-CM | POA: Diagnosis not present

## 2021-10-12 DIAGNOSIS — R42 Dizziness and giddiness: Secondary | ICD-10-CM | POA: Diagnosis not present

## 2021-10-12 DIAGNOSIS — R0981 Nasal congestion: Secondary | ICD-10-CM | POA: Diagnosis not present

## 2021-10-12 DIAGNOSIS — Z20822 Contact with and (suspected) exposure to covid-19: Secondary | ICD-10-CM | POA: Diagnosis not present

## 2021-10-20 ENCOUNTER — Ambulatory Visit
Admission: RE | Admit: 2021-10-20 | Discharge: 2021-10-20 | Disposition: A | Discharge status: Home / Self Care | Payer: BLUE CROSS/BLUE SHIELD | Source: Ambulatory Visit | Attending: Obstetrics and Gynecology | Admitting: Obstetrics and Gynecology

## 2021-10-20 ENCOUNTER — Ambulatory Visit
Admission: RE | Admit: 2021-10-20 | Discharge: 2021-10-20 | Disposition: A | Discharge status: Home / Self Care | Payer: BC Managed Care – PPO | Source: Ambulatory Visit | Attending: Obstetrics and Gynecology | Admitting: Obstetrics and Gynecology

## 2021-10-20 DIAGNOSIS — N644 Mastodynia: Secondary | ICD-10-CM

## 2021-10-20 DIAGNOSIS — N6320 Unspecified lump in the left breast, unspecified quadrant: Secondary | ICD-10-CM

## 2021-11-12 DIAGNOSIS — H9311 Tinnitus, right ear: Secondary | ICD-10-CM | POA: Diagnosis not present

## 2021-12-09 DIAGNOSIS — L570 Actinic keratosis: Secondary | ICD-10-CM | POA: Diagnosis not present

## 2021-12-09 DIAGNOSIS — L814 Other melanin hyperpigmentation: Secondary | ICD-10-CM | POA: Diagnosis not present

## 2021-12-09 DIAGNOSIS — D225 Melanocytic nevi of trunk: Secondary | ICD-10-CM | POA: Diagnosis not present

## 2021-12-09 DIAGNOSIS — L820 Inflamed seborrheic keratosis: Secondary | ICD-10-CM | POA: Diagnosis not present

## 2021-12-09 DIAGNOSIS — R208 Other disturbances of skin sensation: Secondary | ICD-10-CM | POA: Diagnosis not present

## 2021-12-09 DIAGNOSIS — D485 Neoplasm of uncertain behavior of skin: Secondary | ICD-10-CM | POA: Diagnosis not present

## 2021-12-09 DIAGNOSIS — L821 Other seborrheic keratosis: Secondary | ICD-10-CM | POA: Diagnosis not present

## 2021-12-09 DIAGNOSIS — L298 Other pruritus: Secondary | ICD-10-CM | POA: Diagnosis not present

## 2021-12-13 DIAGNOSIS — H6691 Otitis media, unspecified, right ear: Secondary | ICD-10-CM | POA: Diagnosis not present

## 2022-01-04 DIAGNOSIS — Z1151 Encounter for screening for human papillomavirus (HPV): Secondary | ICD-10-CM | POA: Diagnosis not present

## 2022-01-04 DIAGNOSIS — Z13820 Encounter for screening for osteoporosis: Secondary | ICD-10-CM | POA: Diagnosis not present

## 2022-01-04 DIAGNOSIS — Z6836 Body mass index (BMI) 36.0-36.9, adult: Secondary | ICD-10-CM | POA: Diagnosis not present

## 2022-01-04 DIAGNOSIS — Z124 Encounter for screening for malignant neoplasm of cervix: Secondary | ICD-10-CM | POA: Diagnosis not present

## 2022-01-04 DIAGNOSIS — Z01419 Encounter for gynecological examination (general) (routine) without abnormal findings: Secondary | ICD-10-CM | POA: Diagnosis not present

## 2022-01-12 DIAGNOSIS — Z1329 Encounter for screening for other suspected endocrine disorder: Secondary | ICD-10-CM | POA: Diagnosis not present

## 2022-01-12 DIAGNOSIS — Z131 Encounter for screening for diabetes mellitus: Secondary | ICD-10-CM | POA: Diagnosis not present

## 2022-01-12 DIAGNOSIS — E785 Hyperlipidemia, unspecified: Secondary | ICD-10-CM | POA: Diagnosis not present

## 2022-01-12 DIAGNOSIS — E349 Endocrine disorder, unspecified: Secondary | ICD-10-CM | POA: Diagnosis not present

## 2022-01-12 DIAGNOSIS — Z139 Encounter for screening, unspecified: Secondary | ICD-10-CM | POA: Diagnosis not present

## 2022-01-13 DIAGNOSIS — M545 Low back pain, unspecified: Secondary | ICD-10-CM | POA: Diagnosis not present

## 2022-01-13 DIAGNOSIS — M546 Pain in thoracic spine: Secondary | ICD-10-CM | POA: Diagnosis not present

## 2022-01-13 DIAGNOSIS — M5030 Other cervical disc degeneration, unspecified cervical region: Secondary | ICD-10-CM | POA: Diagnosis not present

## 2022-04-20 DIAGNOSIS — H6981 Other specified disorders of Eustachian tube, right ear: Secondary | ICD-10-CM | POA: Diagnosis not present

## 2022-06-24 DIAGNOSIS — H903 Sensorineural hearing loss, bilateral: Secondary | ICD-10-CM | POA: Insufficient documentation

## 2022-06-24 DIAGNOSIS — H9311 Tinnitus, right ear: Secondary | ICD-10-CM | POA: Insufficient documentation

## 2022-10-20 LAB — HM MAMMOGRAPHY

## 2022-10-26 ENCOUNTER — Encounter: Payer: Self-pay | Admitting: Family Medicine

## 2022-11-02 ENCOUNTER — Encounter: Payer: Self-pay | Admitting: Family Medicine

## 2022-11-02 ENCOUNTER — Ambulatory Visit (INDEPENDENT_AMBULATORY_CARE_PROVIDER_SITE_OTHER): Payer: BC Managed Care – PPO | Admitting: Family Medicine

## 2022-11-02 VITALS — BP 121/79 | HR 85 | Temp 98.0°F | Ht 64.25 in | Wt 199.0 lb

## 2022-11-02 DIAGNOSIS — R202 Paresthesia of skin: Secondary | ICD-10-CM

## 2022-11-02 DIAGNOSIS — C439 Malignant melanoma of skin, unspecified: Secondary | ICD-10-CM | POA: Insufficient documentation

## 2022-11-02 DIAGNOSIS — Z7689 Persons encountering health services in other specified circumstances: Secondary | ICD-10-CM

## 2022-11-02 DIAGNOSIS — R5382 Chronic fatigue, unspecified: Secondary | ICD-10-CM | POA: Diagnosis not present

## 2022-11-02 DIAGNOSIS — G479 Sleep disorder, unspecified: Secondary | ICD-10-CM

## 2022-11-02 DIAGNOSIS — R200 Anesthesia of skin: Secondary | ICD-10-CM | POA: Diagnosis not present

## 2022-11-02 HISTORY — DX: Malignant melanoma of skin, unspecified: C43.9

## 2022-11-02 LAB — CBC WITH DIFFERENTIAL/PLATELET
MPV: 11.5 fL (ref 7.5–12.5)
RDW: 12.8 % (ref 11.0–15.0)
Total Lymphocyte: 26.1 %

## 2022-11-02 NOTE — Progress Notes (Unsigned)
Patient ID: Janet George, female  DOB: 07/29/1966, 57 y.o.   MRN: [id number] Patient Care Team    Relationship Specialty Notifications Start End  Ma Hillock, DO PCP - General Family Medicine  11/02/22   Otelia Sergeant, OD Consulting Physician Optometry  11/02/22   Arvella Nigh, MD Consulting Physician Obstetrics and Gynecology  11/02/22   Wilford Corner, MD Consulting Physician Gastroenterology  11/02/22     Chief Complaint  Patient presents with   Establish Care   Fatigue    Pt c/o fatigue, trouble sleep, tingling in hands and feet, HA x 1 year; pt is not fasting     Subjective:  Janet George is a 57 y.o.  female present for new patient establishment. All past medical history, surgical history, allergies, family history, immunizations, medications and social history were updated in the electronic medical record today. All recent labs, ED visits and hospitalizations within the last year were reviewed.  Patient presents for establishment today with complaints of fatigue of about a year in duration.  She reports she can fall asleep easily, but does not stay asleep.  She endorses tingling in her fingertips that has been present for about a year, especially when laying down.  She noticed in the last month she has tingling in the tips of her toes.  She does not believe she snores and no apneic spells reported by her husband.     11/02/2022    2:11 PM  Depression screen PHQ 2/9  Decreased Interest 0  Down, Depressed, Hopeless 0  PHQ - 2 Score 0       No data to display                 No data to display          Immunization History  Administered Date(s) Administered   Influenza Split 06/20/2019   Influenza, Quadrivalent, Recombinant, Inj, Pf 07/27/2018   Influenza, Seasonal, Injecte, Preservative Fre 08/10/2014   Influenza-Unspecified 07/10/2022   Tdap 02/07/2014   Zoster Recombinat (Shingrix) 04/22/2019    No results found.  Past Medical History:   Diagnosis Date   Arthritis    Chest pain with low risk for cardiac etiology 02/19/2016   Chicken pox    Colon polyp    Diverticulitis    Globus sensation 01/04/2017   Malignant melanoma of skin ([place]) 11/02/2022   Migraine    Skin cancer    UTI (urinary tract infection)    No Known Allergies Past Surgical History:  Procedure Laterality Date   CESAREAN SECTION  1996   99, 06   TONSILLECTOMY AND ADENOIDECTOMY  1978   Family History  Problem Relation Age of Onset   Hearing loss Mother    Arthritis Mother    Diabetes Father    Hypertension Father    Heart disease Father    Hyperlipidemia Father    Skin cancer Father    Hearing loss Father    Diabetes Sister    Skin cancer Sister    Hypertension Brother    Breast cancer Maternal Grandmother    Hyperlipidemia Paternal Grandmother    Social History   Social History Narrative   Marital status/children/pets: married, G3P3   Education/employment: BS.  Employed as a VP in Engineer, building services:      -Wears a bicycle helmet riding a bike: Yes     -smoke alarm in the home:Yes     - wears seatbelt:  Yes     - Feels safe in their relationships: Yes       Allergies as of 11/02/2022   No Known Allergies      Medication List    as of November 02, 2022 11:59 PM   You have not been prescribed any medications.     All past medical history, surgical history, allergies, family history, immunizations andmedications were updated in the EMR today and reviewed under the history and medication portions of their EMR.    Recent Results (from the past 2160 hour(s))  CBC with Differential/Platelet     Status: None   Collection Time: 11/02/22  2:39 PM  Result Value Ref Range   WBC 5.1 3.8 - 10.8 Thousand/uL   RBC 4.71 3.80 - 5.10 Million/uL   Hemoglobin 13.9 11.7 - 15.5 g/dL   HCT 41.1 35.0 - 45.0 %   MCV 87.3 80.0 - 100.0 fL   MCH 29.5 27.0 - 33.0 pg   MCHC 33.8 32.0 - 36.0 g/dL   RDW 12.8 11.0 - 15.0 %   Platelets  284 140 - 400 Thousand/uL   MPV 11.5 7.5 - 12.5 fL   Neutro Abs 3,193 1,500 - 7,800 cells/uL   Lymphs Abs 1,331 850 - 3,900 cells/uL   Absolute Monocytes 403 200 - 950 cells/uL   Eosinophils Absolute 143 15 - 500 cells/uL   Basophils Absolute 31 0 - 200 cells/uL   Neutrophils Relative % 62.6 %   Total Lymphocyte 26.1 %   Monocytes Relative 7.9 %   Eosinophils Relative 2.8 %   Basophils Relative 0.6 %  TSH     Status: None   Collection Time: 11/02/22  2:39 PM  Result Value Ref Range   TSH 0.92 0.40 - 4.50 mIU/L  VITAMIN D 25 Hydroxy (Vit-D Deficiency, Fractures)     Status: Abnormal   Collection Time: 11/02/22  2:39 PM  Result Value Ref Range   Vit D, 25-Hydroxy 29 (L) 30 - 100 ng/mL    Comment: Vitamin D Status         25-OH Vitamin D: . Deficiency:                    <20 ng/mL Insufficiency:             20 - 29 ng/mL Optimal:                 > or = 30 ng/mL . For 25-OH Vitamin D testing on patients on  D2-supplementation and patients for whom quantitation  of D2 and D3 fractions is required, the [facility](TM) 25-OH VIT D, (D2,D3), LC/MS/MS is recommended: order  code [id number] (patients >28yr). . See Note 1 . Note 1 . For additional information, please refer to  http://education.[facility].com/faq/FAQ199  (This link is being provided for informational/ educational purposes only.)   Iron, TIBC and Ferritin Panel     Status: Abnormal   Collection Time: 11/02/22  2:39 PM  Result Value Ref Range   Iron 31 (L) 45 - 160 mcg/dL   TIBC 427 250 - 450 mcg/dL (calc)   %SAT 7 (L) 16 - 45 % (calc)   Ferritin 6 (L) 16 - 232 ng/mL  Comp Met (CMET)     Status: Abnormal   Collection Time: 11/02/22  2:39 PM  Result Value Ref Range   Glucose, Bld 95 65 - 99 mg/dL    Comment: .            Fasting  reference interval .    BUN 22 7 - 25 mg/dL   Creat 1.13 (H) 0.50 - 1.03 mg/dL   BUN/Creatinine Ratio 19 6 - 22 (calc)   Sodium 140 135 - 146 mmol/L   Potassium 4.1 3.5 - 5.3  mmol/L   Chloride 104 98 - 110 mmol/L   CO2 30 20 - 32 mmol/L   Calcium 9.7 8.6 - 10.4 mg/dL   Total Protein 7.2 6.1 - 8.1 g/dL   Albumin 4.4 3.6 - 5.1 g/dL   Globulin 2.8 1.9 - 3.7 g/dL (calc)   AG Ratio 1.6 1.0 - 2.5 (calc)   Total Bilirubin 0.3 0.2 - 1.2 mg/dL   Alkaline phosphatase (APISO) 91 37 - 153 U/L   AST 16 10 - 35 U/L   ALT 13 6 - 29 U/L  B12 and Folate Panel     Status: None   Collection Time: 11/02/22  2:39 PM  Result Value Ref Range   Vitamin B-12 648 200 - 1,100 pg/mL   Folate 22.8 ng/mL    Comment:                            Reference Range                            Low:           <3.4                            Borderline:    3.4-5.4                            Normal:        >5.4 .   T4, free     Status: None   Collection Time: 11/02/22  2:39 PM  Result Value Ref Range   Free T4 1.2 0.8 - 1.8 ng/dL  Thyroid peroxidase antibody     Status: Abnormal   Collection Time: 11/02/22  2:39 PM  Result Value Ref Range   Thyroperoxidase Ab SerPl-aCnc 25 (H) <9 IU/mL  Sedimentation rate     Status: None   Collection Time: 11/02/22  2:39 PM  Result Value Ref Range   Sed Rate 9 0 - 30 mm/h  Magnesium     Status: None   Collection Time: 11/02/22  2:39 PM  Result Value Ref Range   Magnesium 2.0 1.5 - 2.5 mg/dL  Methylmalonic Acid, Serum     Status: None   Collection Time: 11/02/22  2:39 PM  Result Value Ref Range   Methylmalonic Acid, Quant 135 87 - 318 nmol/L    Comment: . This test was developed and its analytical performance characteristics have been determined by [place], [place]. It has not been cleared or approved by the U.S. Food and Drug Administration. This assay has been validated pursuant to the CLIA regulations and is used for clinical purposes. Marland Kitchen      ROS 14 pt review of systems performed and negative (unless mentioned in an HPI)  Objective: BP 121/79   Pulse 85   Temp 98 F (36.7 C) (Oral)   Ht 5' 4.25"  (1.632 m)   Wt 199 lb (90.3 kg)   SpO2 98%   BMI 33.89 kg/m  Physical Exam Vitals and nursing note reviewed.  Constitutional:      General: She is not in acute distress.    Appearance: Normal appearance. She is normal weight. She is not ill-appearing or toxic-appearing.  HENT:     Head: Normocephalic and atraumatic.  Eyes:     General: No scleral icterus.       Right eye: No discharge.        Left eye: No discharge.     Extraocular Movements: Extraocular movements intact.     Conjunctiva/sclera: Conjunctivae normal.     Pupils: Pupils are equal, round, and reactive to light.  Cardiovascular:     Rate and Rhythm: Normal rate and regular rhythm.  Pulmonary:     Effort: Pulmonary effort is normal.     Breath sounds: Normal breath sounds.  Skin:    Findings: No rash.  Neurological:     Mental Status: She is alert and oriented to person, place, and time. Mental status is at baseline.     Motor: No weakness.     Coordination: Coordination normal.     Gait: Gait normal.  Psychiatric:        Mood and Affect: Mood normal.        Behavior: Behavior normal.        Thought Content: Thought content normal.        Judgment: Judgment normal.       Assessment/plan: Janet George is a 57 y.o. female present for establish care Establishing care with new doctor, encounter for Fatigue/Numbness and tingling/Sleep disorder We discussed the vast potential diagnoses contributing to the above complaints. Will start with laboratory workup to rule out thyroid disorder, vitamin, mineral deficiencies anemia and lites Keeping in consideration possible sleep disorder/OSA or autoimmune disorder as neck step testing if initial test do not point towards a potential etiology. We also discussed the possibility of cervical or lumbar spine etiology.  May need to refer.  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 - CBC with  Differential/Platelet - TSH - VITAMIN D 25 Hydroxy (Vit-D Deficiency, Fractures) - Iron, TIBC and Ferritin Panel - Comp Met (CMET) - B12 and Folate Panel - T4, free - Thyroid peroxidase antibody - Sedimentation rate - Magnesium - Methylmalonic Acid, Serum Consider Cymbalta or Lyrica to help with symptoms. Follow-up dependent upon laboratory results.  No follow-ups on file.  Orders Placed This Encounter  Procedures   CBC with Differential/Platelet   TSH   VITAMIN D 25 Hydroxy (Vit-D Deficiency, Fractures)   Iron, TIBC and Ferritin Panel   Comp Met (CMET)   B12 and Folate Panel   T4, free   Thyroid peroxidase antibody   Sedimentation rate   Magnesium   Methylmalonic Acid, Serum   No orders of the defined types were placed in this encounter.  Referral Orders  No referral(s) requested today     Note is dictated utilizing voice recognition software. Although note has been proof read prior to signing, occasional typographical errors still can be missed. If any questions arise, please do not hesitate to call for verification.  Electronically signed by: Howard Pouch, DO [place]

## 2022-11-03 LAB — T4, FREE: Free T4: 1.2 ng/dL (ref 0.8–1.8)

## 2022-11-03 LAB — COMPREHENSIVE METABOLIC PANEL
CO2: 30 mmol/L (ref 20–32)
Sodium: 140 mmol/L (ref 135–146)
Total Protein: 7.2 g/dL (ref 6.1–8.1)

## 2022-11-03 LAB — CBC WITH DIFFERENTIAL/PLATELET
Basophils Absolute: 31 cells/uL (ref 0–200)
Basophils Relative: 0.6 %
HCT: 41.1 % (ref 35.0–45.0)
MCH: 29.5 pg (ref 27.0–33.0)
Neutro Abs: 3193 cells/uL (ref 1500–7800)

## 2022-11-03 LAB — TSH: TSH: 0.92 mIU/L (ref 0.40–4.50)

## 2022-11-03 LAB — SEDIMENTATION RATE: Sed Rate: 9 mm/h (ref 0–30)

## 2022-11-03 LAB — IRON,TIBC AND FERRITIN PANEL: TIBC: 427 mcg/dL (calc) (ref 250–450)

## 2022-11-03 LAB — MAGNESIUM: Magnesium: 2 mg/dL (ref 1.5–2.5)

## 2022-11-03 LAB — B12 AND FOLATE PANEL: Vitamin B-12: 648 pg/mL (ref 200–1100)

## 2022-11-04 ENCOUNTER — Telehealth: Payer: Self-pay | Admitting: Family Medicine

## 2022-11-04 LAB — COMPREHENSIVE METABOLIC PANEL
AG Ratio: 1.6 (calc) (ref 1.0–2.5)
Alkaline phosphatase (APISO): 91 U/L (ref 37–153)
BUN/Creatinine Ratio: 19 (calc) (ref 6–22)
Calcium: 9.7 mg/dL (ref 8.6–10.4)
Glucose, Bld: 95 mg/dL (ref 65–99)
Potassium: 4.1 mmol/L (ref 3.5–5.3)

## 2022-11-04 LAB — IRON,TIBC AND FERRITIN PANEL
Ferritin: 6 ng/mL — ABNORMAL LOW (ref 16–232)
Iron: 31 ug/dL — ABNORMAL LOW (ref 45–160)

## 2022-11-04 LAB — CBC WITH DIFFERENTIAL/PLATELET
Hemoglobin: 13.9 g/dL (ref 11.7–15.5)
Lymphs Abs: 1331 cells/uL (ref 850–3900)
MCV: 87.3 fL (ref 80.0–100.0)
Monocytes Relative: 7.9 %
Neutrophils Relative %: 62.6 %
Platelets: 284 10*3/uL (ref 140–400)

## 2022-11-04 LAB — THYROID PEROXIDASE ANTIBODY: Thyroperoxidase Ab SerPl-aCnc: 25 IU/mL — ABNORMAL HIGH (ref <9)

## 2022-11-04 MED ORDER — POLYSACCHARIDE IRON COMPLEX 434.8 (200 FE) MG PO CAPS: 1.0000 | ORAL_CAPSULE | Freq: Every day | ORAL | 1 refills | Status: DC

## 2022-11-04 NOTE — Telephone Encounter (Signed)
Please call patient: Her kidney function is just mildly abnormal.  Since I do not have any labs to compare to except some from 2549, I am not certain if this has been a chronic condition for her or not.  I would encourage her to hydrate well to keep kidneys healthy.  This is just a mild abnormality by lab, so we will continue to monitor with her routine visits.  Her vitamin D is just mildly low at 29, I would encourage her to start 1000 units of over-the-counter D3 daily to help bump vitamin D up into normal range. Her B12 and folate levels are normal. Her blood cell counts are all normal.  No signs of anemia or infection. Thyroid is functioning normal. Inflammatory marker is normal. Magnesium levels are normal.  Her iron levels are significantly low.  This is likely contributing to her symptoms. I recommend she start an iron supplement as soon as possible.  I have called in an iron supplement for her to start once daily.   -I always recommend a daily stool softener such as Colace when somebody has to take an iron supplement, it can cause constipation.  Thyroid antibodies and 1 additional lab is still pending-we will have these back until Monday.  Will call her with those results once received.  I would recommend follow-up in 8 weeks when her iron deficiency for recheck

## 2022-11-04 NOTE — Telephone Encounter (Signed)
LM for pt to return call to discuss.  

## 2022-11-05 LAB — COMPREHENSIVE METABOLIC PANEL
ALT: 13 U/L (ref 6–29)
AST: 16 U/L (ref 10–35)
Albumin: 4.4 g/dL (ref 3.6–5.1)
BUN: 22 mg/dL (ref 7–25)
Chloride: 104 mmol/L (ref 98–110)
Creat: 1.13 mg/dL — ABNORMAL HIGH (ref 0.50–1.03)
Globulin: 2.8 g/dL (calc) (ref 1.9–3.7)
Total Bilirubin: 0.3 mg/dL (ref 0.2–1.2)

## 2022-11-05 LAB — IRON,TIBC AND FERRITIN PANEL: %SAT: 7 % (calc) — ABNORMAL LOW (ref 16–45)

## 2022-11-05 LAB — CBC WITH DIFFERENTIAL/PLATELET
Absolute Monocytes: 403 cells/uL (ref 200–950)
Eosinophils Absolute: 143 cells/uL (ref 15–500)
Eosinophils Relative: 2.8 %
MCHC: 33.8 g/dL (ref 32.0–36.0)
RBC: 4.71 10*6/uL (ref 3.80–5.10)
WBC: 5.1 10*3/uL (ref 3.8–10.8)

## 2022-11-05 LAB — VITAMIN D 25 HYDROXY (VIT D DEFICIENCY, FRACTURES): Vit D, 25-Hydroxy: 29 ng/mL — ABNORMAL LOW (ref 30–100)

## 2022-11-05 LAB — B12 AND FOLATE PANEL: Folate: 22.8 ng/mL

## 2022-11-05 LAB — METHYLMALONIC ACID, SERUM: Methylmalonic Acid, Quant: 135 nmol/L (ref 87–318)

## 2022-11-07 ENCOUNTER — Telehealth: Payer: Self-pay | Admitting: Family Medicine

## 2022-11-07 ENCOUNTER — Encounter: Payer: Self-pay | Admitting: Family Medicine

## 2022-11-07 DIAGNOSIS — R5382 Chronic fatigue, unspecified: Secondary | ICD-10-CM | POA: Insufficient documentation

## 2022-11-07 DIAGNOSIS — G479 Sleep disorder, unspecified: Secondary | ICD-10-CM | POA: Insufficient documentation

## 2022-11-07 DIAGNOSIS — R200 Anesthesia of skin: Secondary | ICD-10-CM | POA: Insufficient documentation

## 2022-11-07 NOTE — Telephone Encounter (Signed)
Please inform patient her MMA test is normal. Her TPO/thyroid antibody test is just mildly elevated at 25.  Her thyroid panel is normal and there is no room to add any thyroid supplement with her current thyroid levels without causing abnormality.  We will continue to watch the antibodies and thyroid panel routinely.

## 2022-11-07 NOTE — Telephone Encounter (Signed)
Spoke with pt regarding labs and instructions.   

## 2022-12-14 DIAGNOSIS — D225 Melanocytic nevi of trunk: Secondary | ICD-10-CM | POA: Diagnosis not present

## 2022-12-14 DIAGNOSIS — L814 Other melanin hyperpigmentation: Secondary | ICD-10-CM | POA: Diagnosis not present

## 2022-12-14 DIAGNOSIS — D2272 Melanocytic nevi of left lower limb, including hip: Secondary | ICD-10-CM | POA: Diagnosis not present

## 2022-12-14 DIAGNOSIS — D2239 Melanocytic nevi of other parts of face: Secondary | ICD-10-CM | POA: Diagnosis not present

## 2022-12-14 DIAGNOSIS — L570 Actinic keratosis: Secondary | ICD-10-CM | POA: Diagnosis not present

## 2023-01-02 ENCOUNTER — Ambulatory Visit: Payer: BC Managed Care – PPO | Admitting: Family Medicine

## 2023-01-02 VITALS — BP 117/82 | HR 77 | Temp 97.9°F | Wt 203.0 lb

## 2023-01-02 DIAGNOSIS — R09A2 Foreign body sensation, throat: Secondary | ICD-10-CM | POA: Insufficient documentation

## 2023-01-02 DIAGNOSIS — R200 Anesthesia of skin: Secondary | ICD-10-CM | POA: Diagnosis not present

## 2023-01-02 DIAGNOSIS — R5382 Chronic fatigue, unspecified: Secondary | ICD-10-CM

## 2023-01-02 DIAGNOSIS — R490 Dysphonia: Secondary | ICD-10-CM | POA: Diagnosis not present

## 2023-01-02 DIAGNOSIS — R202 Paresthesia of skin: Secondary | ICD-10-CM

## 2023-01-02 DIAGNOSIS — E611 Iron deficiency: Secondary | ICD-10-CM

## 2023-01-02 DIAGNOSIS — K22719 Barrett's esophagus with dysplasia, unspecified: Secondary | ICD-10-CM

## 2023-01-02 MED ORDER — DULOXETINE HCL 30 MG PO CPEP: 30.0000 mg | ORAL_CAPSULE | Freq: Every day | ORAL | 1 refills | Status: DC

## 2023-01-02 NOTE — Patient Instructions (Signed)
No follow-ups on file.        Great to see you today.  I have refilled the medication(s) we provide.   If labs were collected, we will inform you of lab results once received either by echart message or telephone call.   - echart message- for normal results that have been seen by the patient already.   - telephone call: abnormal results or if patient has not viewed results in their echart.  

## 2023-01-02 NOTE — Progress Notes (Signed)
Janet George , [birth year], 57 y.o., female MRN: [id number] Patient Care Team    Relationship Specialty Notifications Start End  Ma Hillock, DO PCP - General Family Medicine  11/02/22   Otelia Sergeant, OD Consulting Physician Optometry  11/02/22   Arvella Nigh, MD Consulting Physician Obstetrics and Gynecology  11/02/22   Wilford Corner, MD Consulting Physician Gastroenterology  11/02/22     Chief Complaint  Patient presents with   Anemia    States she still feels terrible and slightly worse; c/o fatigue, tingling in legs and arms, dizziness;iron has not been helping     Subjective: Janet George is a 57 y.o. Pt presents for an OV to follow-up on iron deficiency after starting supplementation.  Last appointment patient presented for new patient establishment with  complaints of fatigue of about a year in duration.   She reported she can fall asleep easily, but does not stay asleep.  She endorsed tingling in her fingertips that has been present for about a year, especially when laying down.  She noticed in the last month she has tingling in the tips of her toes.  She does not believe she snores and no apneic spells reported by her husband. On labs she was found to be iron deficient with an iron level of 31, percent saturation 7 and ferritin of 6 vitamin D was mildly low at 29, B12 midrange at 648 CBC unremarkable.  She had mildly elevated TPO at 25 with normal TSH and T4 free.Please inform patient her MMA test is normal. Her TPO/thyroid antibody test is just mildly elevated at 25.  Her thyroid panel is normal and there is no room to add any thyroid supplement with her current thyroid levels without causing abnormality.  We will continue to watch the antibodies and thyroid panel routinely.  Today patient reports she still feel fatigued and does not feel the iron supplementation has helped. Still has dizziness and tingling in her fingertips and toes. She did see a NS over a DECADE  ago and told her she would eventually need surgery, but she does not know why exactly. She did not care for that doctor and never returned.   She also complains of globus sensation and hoarseness of 1 month. She is not taking allergy medications. H/O of barrett esophagus. Not taking PPI.      11/02/2022    2:11 PM  Depression screen PHQ 2/9  Decreased Interest 0  Down, Depressed, Hopeless 0  PHQ - 2 Score 0    No Known Allergies Social History   Social History Narrative   Marital status/children/pets: married, G3P3   Education/employment: BS.  Employed as a VP in Engineer, building services:      -Wears a bicycle helmet riding a bike: Yes     -smoke alarm in the home:Yes     - wears seatbelt: Yes     - Feels safe in their relationships: Yes      Past Medical History:  Diagnosis Date   Arthritis    Chest pain with low risk for cardiac etiology 02/19/2016   Chicken pox    Colon polyp    Diverticulitis    Globus sensation 01/04/2017   Malignant melanoma of skin ([place]) 11/02/2022   Migraine    Skin cancer    UTI (urinary tract infection)    Past Surgical History:  Procedure Laterality Date   CESAREAN SECTION  1996   99,  06   TONSILLECTOMY AND ADENOIDECTOMY  1978   Family History  Problem Relation Age of Onset   Hearing loss Mother    Arthritis Mother    Diabetes Father    Hypertension Father    Heart disease Father    Hyperlipidemia Father    Skin cancer Father    Hearing loss Father    Diabetes Sister    Skin cancer Sister    Hypertension Brother    Breast cancer Maternal Grandmother    Hyperlipidemia Paternal Grandmother    Allergies as of 01/02/2023   No Known Allergies      Medication List        Accurate as of January 02, 2023 11:07 AM. If you have any questions, ask your nurse or doctor.          DULoxetine 30 MG capsule Commonly known as: Cymbalta Take 1 capsule (30 mg total) by mouth daily. Started by: Howard Pouch, DO    Polysaccharide Iron Complex 434.8 (200 Fe) MG Caps Take 1 capsule by mouth daily.        All past medical history, surgical history, allergies, family history, immunizations andmedications were updated in the EMR today and reviewed under the history and medication portions of their EMR.     ROS Negative, with the exception of above mentioned in HPI   Objective:  BP 117/82   Pulse 77   Temp 97.9 F (36.6 C)   Wt 203 lb (92.1 kg)   SpO2 100%   BMI 34.57 kg/m  Body mass index is 34.57 kg/m. Physical Exam Vitals and nursing note reviewed.  Constitutional:      General: She is not in acute distress.    Appearance: Normal appearance. She is normal weight. She is not ill-appearing or toxic-appearing.  HENT:     Head: Normocephalic and atraumatic.  Eyes:     General: No scleral icterus.       Right eye: No discharge.        Left eye: No discharge.     Extraocular Movements: Extraocular movements intact.     Conjunctiva/sclera: Conjunctivae normal.     Pupils: Pupils are equal, round, and reactive to light.  Cardiovascular:     Rate and Rhythm: Normal rate and regular rhythm.  Skin:    Findings: No rash.  Neurological:     Mental Status: She is alert and oriented to person, place, and time. Mental status is at baseline.     Motor: No weakness.     Coordination: Coordination normal.     Gait: Gait normal.  Psychiatric:        Mood and Affect: Mood normal.        Behavior: Behavior normal.        Thought Content: Thought content normal.        Judgment: Judgment normal.    No results found. No results found. No results found for this or any previous visit (from the past 24 hour(s)).  Assessment/Plan: Janet George is a 57 y.o. female present for OV for  Iron deficiency/Chronic fatigue - IBC + Ferritin Continue iron supplement.   Numbness and tingling Located in toes and fingertips. Lab work did not reveal cause. Sounds like prior h/o of cervical?  Radiculopathy or stenosis. Difficult to be certain, regardless she would not return to that doctor and it has been over a decade ago.  - Ambulatory referral to Neurology  Hoarseness/globus sensation: Should start allergy medication.  Referral to  ENT placed for further eval Referral to GI for h/o barrett's  Reviewed expectations re: course of current medical issues. Discussed self-management of symptoms. Outlined signs and symptoms indicating need for more acute intervention. Patient verbalized understanding and all questions were answered. Patient received an After-Visit Summary.    Orders Placed This Encounter  Procedures   IBC + Ferritin   Ambulatory referral to Neurology   Ambulatory referral to Gastroenterology   Ambulatory referral to ENT   Meds ordered this encounter  Medications   DULoxetine (CYMBALTA) 30 MG capsule    Sig: Take 1 capsule (30 mg total) by mouth daily.    Dispense:  90 capsule    Refill:  1   Referral Orders         Ambulatory referral to Neurology         Ambulatory referral to Gastroenterology         Ambulatory referral to ENT       Note is dictated utilizing voice recognition software. Although note has been proof read prior to signing, occasional typographical errors still can be missed. If any questions arise, please do not hesitate to call for verification.   electronically signed by:  Howard Pouch, DO  [place]

## 2023-01-03 ENCOUNTER — Other Ambulatory Visit: Payer: Self-pay | Admitting: Family Medicine

## 2023-01-03 LAB — IBC + FERRITIN
Ferritin: 7.9 ng/mL — ABNORMAL LOW (ref 10.0–291.0)
Iron: 25 ug/dL — ABNORMAL LOW (ref 42–145)
Saturation Ratios: 5.1 % — ABNORMAL LOW (ref 20.0–50.0)
TIBC: 490 ug/dL — ABNORMAL HIGH (ref 250.0–450.0)
Transferrin: 350 mg/dL (ref 212.0–360.0)

## 2023-01-03 MED ORDER — POLYSACCHARIDE IRON COMPLEX 434.8 (200 FE) MG PO CAPS: 1.0000 | ORAL_CAPSULE | Freq: Two times a day (BID) | ORAL | 1 refills | Status: AC

## 2023-01-11 ENCOUNTER — Encounter: Payer: Self-pay | Admitting: Neurology

## 2023-01-31 ENCOUNTER — Encounter: Payer: Self-pay | Admitting: Neurology

## 2023-01-31 ENCOUNTER — Ambulatory Visit: Payer: BC Managed Care – PPO | Admitting: Neurology

## 2023-01-31 VITALS — BP 132/82 | HR 97 | Ht 64.25 in | Wt 206.0 lb

## 2023-01-31 DIAGNOSIS — R42 Dizziness and giddiness: Secondary | ICD-10-CM | POA: Diagnosis not present

## 2023-01-31 DIAGNOSIS — R519 Headache, unspecified: Secondary | ICD-10-CM

## 2023-01-31 DIAGNOSIS — R202 Paresthesia of skin: Secondary | ICD-10-CM

## 2023-01-31 MED ORDER — NORTRIPTYLINE HCL 10 MG PO CAPS: ORAL_CAPSULE | ORAL | 3 refills | Status: DC

## 2023-01-31 NOTE — Progress Notes (Signed)
[facility] Neurology Division Clinic Note - Initial Visit   Date: 01/31/2023   Janet George MRN: [id number] DOB: 1966/05/02   Dear Dr. Claiborne Billings:  Thank you for your kind referral of Janet George for consultation of numbness/tingling and headaches. Although her history is well known to you, please allow [place] to reiterate it for the purpose of our medical record. The patient was accompanied to the clinic by self.    Janet George is a 57 y.o. right-handed female presenting for evaluation of numbness/tingling, headaches, and lightheadedness.   IMPRESSION/PLAN: Paresthesias of the arms and legs, nonspecific.  Symptoms do not follow a nerve distribution and too diffuse for entrapment neuropathy, radiculopathy, and with normal exam, doubt this to be neuropathy.   - NCS/EMG of the right arm and leg to help characterize the nature of her symptoms  2.  New onset headaches > 58 years old warrants imaging.   - MRI brain wwo contrast  - MRA head and neck  - Start nortriptyline 10mg  at bedtime for 2 week, then increase to 2 tablet at bedtime  3.  Lightheadedness.  No evidence of cerebellar dysfunction or symptoms suggestive of vertigo.  ?if this could be related to her low iron levels.   Further recommendations pending results.   ------------------------------------------------------------- History of present illness: For the past year, she has numbness/tingling involving the arms, hands, lower legs, and feet.  It occurs daily, lasting anywhere for 30-minutes to several hours.  No specific triggers.  It occurs more when she lays down.  She had a pedicure 6 weeks ago and felt that the pressure applied to her legs was painful.  Marland KitchenShe was given Cymbalta 30mg /d for symptoms, but did not notice any change, so stopped it.   She reports having daily headaches for the past 6 months.  Pain is bifrontal, described as achy.  Pain lasts all day.  She takes ibuprofen 2-3 times per week.   Headache are not worse with sneezing, coughing, or bearing down.  She reports having a few migraines in the past few months which is associated nausea/vomiting.    She also complains of lightheadedness since January 2024.  She was found to have iron supplementation and after starting this her fatigue has improved, but no change to other symptoms. .    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  She does not smoke.  She drinks occasionally on the weekend (1-2 glasses of wine).    In January 2023, she had an ear infection and was started on antibiotics.  She has fluid behind her ear, followed by vertigo and tinnitus.  She was seen by ENT who did not have any treatment recommendations.  Since this time, she has developed various symptoms as mentioned above.    Out-side paper records, electronic medical record, and images have been reviewed where available and summarized as:  No results found for: "HGBA1C" Lab Results  Component Value Date   VITAMINB12 648 11/02/2022   Lab Results  Component Value Date   TSH 0.92 11/02/2022   Lab Results  Component Value Date   ESRSEDRATE 9 11/02/2022    Past Medical History:  Diagnosis Date   Arthritis    Chest pain with low risk for cardiac etiology 02/19/2016   Chicken pox    Colon polyp    Diverticulitis    Globus sensation 01/04/2017   Malignant melanoma of skin 11/02/2022   Migraine    Skin cancer  UTI (urinary tract infection)     Past Surgical History:  Procedure Laterality Date   CESAREAN SECTION  1996   99, 06   TONSILLECTOMY AND ADENOIDECTOMY  1978     Medications:  Outpatient Encounter Medications as of 01/31/2023  Medication Sig   DULoxetine (CYMBALTA) 30 MG capsule Take 1 capsule (30 mg total) by mouth daily.   Polysaccharide Iron Complex 434.8 (200 Fe) MG CAPS Take 1 capsule by mouth 2 (two) times daily.   No facility-administered encounter medications on file as of 01/31/2023.    Allergies: No  Known Allergies  Family History: Family History  Problem Relation Age of Onset   Hearing loss Mother    Arthritis Mother    Diabetes Father    Hypertension Father    Heart disease Father    Hyperlipidemia Father    Skin cancer Father    Hearing loss Father    Diabetes Sister    Skin cancer Sister    Hypertension Brother    Breast cancer Maternal Grandmother    Hyperlipidemia Paternal Grandmother     Social History: Social History   Tobacco Use   Smoking status: Former    Types: Cigarettes    Quit date: 1988    Years since quitting: 36.3    Passive exposure: Never  Vaping Use   Vaping Use: Never used  Substance Use Topics   Alcohol use: Yes    Comment: weekends    Drug use: No   Social History   Social History Narrative   Marital status/children/pets: married, G3P3   Education/employment: BS.  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 Lives with husband and son     Vital Signs:  BP 132/82   Pulse 97   Ht 5' 4.25" (1.632 m)   Wt 206 lb (93.4 kg)   SpO2 98%   BMI 35.09 kg/m     Neurological Exam: MENTAL STATUS including orientation to time, place, person, recent and remote memory, attention span and concentration, language, and fund of knowledge is normal.  Speech is not dysarthric.  CRANIAL NERVES: II:  No visual field defects.     III-IV-VI: Pupils round and reactive to light, anisocoria with right pupil slightly larger than left.  Normal conjugate, extra-ocular eye movements in all directions of gaze.  No nystagmus.  No ptosis.   V:  Normal facial sensation.    VII:  Normal facial symmetry and movements.   VIII:  Normal hearing and vestibular function.   IX-X:  Normal palatal movement.   XI:  Normal shoulder shrug and head rotation.    XII:  Normal tongue strength and range of motion, no deviation or fasciculation.  MOTOR:  No atrophy, fasciculations or abnormal movements.  No pronator drift.   Upper Extremity:  Right  Left  Deltoid  5/5   5/5   Biceps  5/5   5/5   Triceps  5/5   5/5   Wrist extensors  5/5   5/5   Wrist flexors  5/5   5/5   Finger extensors  5/5   5/5   Finger flexors  5/5   5/5   Dorsal interossei  5/5   5/5   Abductor pollicis  5/5   5/5   Tone (Ashworth scale)  0  0   Lower Extremity:  Right  Left  Hip flexors  5/5   5/5   Knee flexors  5/5   5/5   Knee extensors  5/5   5/5   Dorsiflexors  5/5   5/5   Plantarflexors  5/5   5/5   Toe extensors  5/5   5/5   Toe flexors  5/5   5/5   Tone (Ashworth scale)  0  0   MSRs:                                           Right        Left brachioradialis 2+  2+  biceps 2+  2+  triceps 2+  2+  patellar 2+  2+  ankle jerk 2+  2+  Hoffman no  no  plantar response down  down   SENSORY:  Normal and symmetric perception of light touch, pinprick, vibration, and temperature.  Romberg's sign absent. Tinel's sign at the wrists and elbow is negative.   COORDINATION/GAIT: Normal finger-to- nose-finger.  Intact rapid alternating movements bilaterally. Gait narrow based and stable. Tandem and stressed gait intact.     Thank you for allowing me to participate in patient's care.  If I can answer any additional questions, I would be pleased to do so.    Sincerely,    Hanson Medeiros K. Allena Katz, DO

## 2023-01-31 NOTE — Patient Instructions (Addendum)
Start nortriptyline  at bedtime for 2 week, then increase to 2 tablet at bedtime  MRI/A brain wwo contrast  Nerve testing of the right arm and leg  ELECTROMYOGRAM AND NERVE CONDUCTION STUDIES (EMG/NCS) INSTRUCTIONS  How to Prepare The neurologist conducting the EMG will need to know if you have certain medical conditions. Tell the neurologist and other EMG lab personnel if you: Have a pacemaker or any other electrical medical device Take blood-thinning medications Have hemophilia, a blood-clotting disorder that causes prolonged bleeding Bathing Take a shower or bath shortly before your exam in order to remove oils from your skin. Don't apply lotions or creams before the exam.  What to Expect You'll likely be asked to change into a hospital gown for the procedure and lie down on an examination table. The following explanations can help you understand what will happen during the exam.  Electrodes. The neurologist or a technician places surface electrodes at various locations on your skin depending on where you're experiencing symptoms. Or the neurologist may insert needle electrodes at different sites depending on your symptoms.  Sensations. The electrodes will at times transmit a tiny electrical current that you may feel as a twinge or spasm. The needle electrode may cause discomfort or pain that usually ends shortly after the needle is removed. If you are concerned about discomfort or pain, you may want to talk to the neurologist about taking a short break during the exam.  Instructions. During the needle EMG, the neurologist will assess whether there is any spontaneous electrical activity when the muscle is at rest - activity that isn't present in healthy muscle tissue - and the degree of activity when you slightly contract the muscle.  He or she will give you instructions on resting and contracting a muscle at appropriate times. Depending on what muscles and nerves the neurologist is  examining, he or she may ask you to change positions during the exam.  After your EMG You may experience some temporary, minor bruising where the needle electrode was inserted into your muscle. This bruising should fade within several days. If it persists, contact your primary care doctor.

## 2023-02-03 ENCOUNTER — Ambulatory Visit: Payer: BC Managed Care – PPO | Admitting: Neurology

## 2023-02-03 DIAGNOSIS — R202 Paresthesia of skin: Secondary | ICD-10-CM

## 2023-02-03 DIAGNOSIS — R42 Dizziness and giddiness: Secondary | ICD-10-CM

## 2023-02-03 DIAGNOSIS — R519 Headache, unspecified: Secondary | ICD-10-CM

## 2023-02-03 NOTE — Procedures (Signed)
[facility] Neurology  [address] [place], [address]  [place], [place] [address] Tel: [phone] Fax: [phone] Test Date:  02/03/2023  Patient: Janet George DOB: 20-Sep-1966 Physician: Nita Sickle, DO  Sex: Female Height: 5\' 4"  Ref Phys: Nita Sickle, DO  ID#: [id number]   Technician:    History: This is a 57 year old female referred for evaluation of generalized paresthesias.  NCV & EMG Findings: Extensive electrodiagnostic testing of the right upper and lower extremity shows: All sensory responses including the right median, ulnar, mixed palmar, sural, and superficial peroneal nerves are within normal limits. All motor responses including the right median, ulnar, peroneal, and tibial nerves are within normal limits. Right tibial H reflex study is within normal limits. There is no evidence of active or chronic motor axonal loss changes affecting any of the tested muscles.  Motor unit configuration and recruitment pattern is within normal limits.   Impression: This is a normal study of the right upper and lower extremities.  In particular, there is no evidence of a large fiber sensorimotor polyneuropathy, cervical/lumbosacral radiculopathy, or carpal tunnel syndrome.   ___________________________ Nita Sickle, DO    Nerve Conduction Studies   Stim Site NR Peak (ms) Norm Peak (ms) O-P Amp (V) Norm O-P Amp  Right Median Anti Sensory (2nd Digit)  32 C  Wrist    2.8 <3.6 29.4 >15  Right Sup Peroneal Anti Sensory (Ant Lat Mall)  32 C  12 cm    2.7 <4.6 7.6 >4  Right Sural Anti Sensory (Lat Mall)  32 C  Calf    2.9 <4.6 12.8 >4  Right Ulnar Anti Sensory (5th Digit)  32 C  Wrist    2.4 <3.1 22.3 >10     Stim Site NR Onset (ms) Norm Onset (ms) O-P Amp (mV) Norm O-P Amp Site1 Site2 Delta-0 (ms) Dist (cm) Vel (m/s) Norm Vel (m/s)  Right Median Motor (Abd Poll Brev)  32 C  Wrist    2.3 <4.0 10.3 >6 Elbow Wrist 4.4 29.0 66 >50  Elbow    6.7  10.1         Right Peroneal  Motor (Ext Dig Brev)  32 C  Ankle    3.0 <6.0 4.0 >2.5 B Fib Ankle 6.9 35.0 51 >40  B Fib    9.9  4.0  Poplt B Fib 1.5 8.0 53 >40  Poplt    11.4  3.9         Right Tibial Motor (Abd Hall Brev)  32 C  Ankle    4.1 <6.0 11.0 >4 Knee Ankle 6.8 42.0 62 >40  Knee    10.9  8.0         Right Ulnar Motor (Abd Dig Minimi)  32 C  Wrist    2.0 <3.1 10.7 >7 B Elbow Wrist 3.5 21.0 60 >50  B Elbow    5.5  9.8  A Elbow B Elbow 1.9 10.0 53 >50  A Elbow    7.4  9.4            Stim Site NR Peak (ms) Norm Peak (ms) P-T Amp (V) Site1 Site2 Delta-P (ms) Norm Delta (ms)  Right Median/Ulnar Palm Comparison (Wrist - 8cm)  32 C  Median Palm    1.5 <2.2 57.3 Median Palm Ulnar Palm 0.1   Ulnar Palm    1.4 <2.2 19.1       Electromyography   Side Muscle Ins.Act Fibs Fasc Recrt Amp Dur Poly Activation Comment  Right 1stDorInt Nml Nml Nml Nml Nml Nml Nml Nml N/A  Right PronatorTeres Nml Nml Nml Nml Nml Nml Nml Nml N/A  Right Biceps Nml Nml Nml Nml Nml Nml Nml Nml N/A  Right Triceps Nml Nml Nml Nml Nml Nml Nml Nml N/A  Right Deltoid Nml Nml Nml Nml Nml Nml Nml Nml N/A  Right AntTibialis Nml Nml Nml Nml Nml Nml Nml Nml N/A  Right Gastroc Nml Nml Nml Nml Nml Nml Nml Nml N/A  Right Flex Dig Long Nml Nml Nml Nml Nml Nml Nml Nml N/A  Right BicepsFemS Nml Nml Nml Nml Nml Nml Nml Nml N/A  Right GluteusMed Nml Nml Nml Nml Nml Nml Nml Nml N/A      Waveforms:

## 2023-02-08 ENCOUNTER — Encounter: Payer: Self-pay | Admitting: Neurology

## 2023-02-09 ENCOUNTER — Encounter: Payer: Self-pay | Admitting: Neurology

## 2023-02-23 ENCOUNTER — Encounter: Payer: BC Managed Care – PPO | Admitting: Neurology

## 2023-02-24 ENCOUNTER — Encounter: Payer: Self-pay | Admitting: Neurology

## 2023-03-08 ENCOUNTER — Ambulatory Visit
Admission: RE | Admit: 2023-03-08 | Discharge: 2023-03-08 | Disposition: A | Discharge status: Home / Self Care | Payer: Self-pay | Source: Ambulatory Visit | Attending: Neurology | Admitting: Neurology

## 2023-03-08 ENCOUNTER — Ambulatory Visit
Admission: RE | Admit: 2023-03-08 | Discharge: 2023-03-08 | Disposition: A | Discharge status: Home / Self Care | Payer: BC Managed Care – PPO | Source: Ambulatory Visit | Attending: Neurology | Admitting: Neurology

## 2023-03-08 DIAGNOSIS — R42 Dizziness and giddiness: Secondary | ICD-10-CM

## 2023-03-08 DIAGNOSIS — R519 Headache, unspecified: Secondary | ICD-10-CM | POA: Diagnosis not present

## 2023-03-08 DIAGNOSIS — R202 Paresthesia of skin: Secondary | ICD-10-CM

## 2023-03-08 DIAGNOSIS — G9389 Other specified disorders of brain: Secondary | ICD-10-CM | POA: Diagnosis not present

## 2023-03-08 MED ORDER — GADOPICLENOL 0.5 MMOL/ML IV SOLN
9.0000 mL | Freq: Once | INTRAVENOUS | Status: AC | PRN
  Administered 2023-03-08: 9 mL via INTRAVENOUS

## 2023-03-17 ENCOUNTER — Telehealth: Payer: Self-pay | Admitting: Neurology

## 2023-03-17 NOTE — Telephone Encounter (Signed)
Pt would like the MRI results

## 2023-03-20 NOTE — Telephone Encounter (Signed)
Please let pt know that MRA head and neck does not show any severe narrowing to abnormalities to explain her symptoms or headaches.

## 2023-03-20 NOTE — Telephone Encounter (Signed)
[facility] Radiology [phone] and images will be read.

## 2023-03-20 NOTE — Telephone Encounter (Signed)
Called patient and provided her with results and reccomendations. 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 Patient would like to more about that. Informed patient I would send Dr. Allena Katz a message and get back to her.

## 2023-03-20 NOTE — Telephone Encounter (Signed)
Please reassure patient that this is a tiny outpouching of the blood vessel, it is not anything worrisome that would cause her any problems.  She is supposed to get MRI brain.  We will schedule a follow-up after this and review the imaging in the office to address any questions/concerns.

## 2023-03-21 ENCOUNTER — Ambulatory Visit
Admission: RE | Admit: 2023-03-21 | Discharge: 2023-03-21 | Disposition: A | Discharge status: Home / Self Care | Payer: Self-pay | Source: Ambulatory Visit | Attending: Neurology | Admitting: Neurology

## 2023-03-21 DIAGNOSIS — R42 Dizziness and giddiness: Secondary | ICD-10-CM

## 2023-03-21 DIAGNOSIS — G9389 Other specified disorders of brain: Secondary | ICD-10-CM | POA: Diagnosis not present

## 2023-03-21 DIAGNOSIS — R519 Headache, unspecified: Secondary | ICD-10-CM

## 2023-03-21 DIAGNOSIS — R202 Paresthesia of skin: Secondary | ICD-10-CM | POA: Diagnosis not present

## 2023-03-21 MED ORDER — GADOPICLENOL 0.5 MMOL/ML IV SOLN
10.0000 mL | Freq: Once | INTRAVENOUS | Status: AC | PRN
  Administered 2023-03-21: 10 mL via INTRAVENOUS

## 2023-03-21 NOTE — Telephone Encounter (Signed)
Called patient and informed he of Dr. Eliane Decree response below. Patient verbalized understanding and had no further questions or concerns.

## 2023-04-04 ENCOUNTER — Encounter: Payer: Self-pay | Admitting: Neurology

## 2023-04-04 ENCOUNTER — Ambulatory Visit: Payer: BC Managed Care – PPO | Admitting: Neurology

## 2023-04-04 VITALS — BP 125/90 | HR 91 | Ht 65.0 in | Wt 207.0 lb

## 2023-04-04 DIAGNOSIS — R519 Headache, unspecified: Secondary | ICD-10-CM

## 2023-04-04 DIAGNOSIS — R202 Paresthesia of skin: Secondary | ICD-10-CM | POA: Diagnosis not present

## 2023-04-04 DIAGNOSIS — R42 Dizziness and giddiness: Secondary | ICD-10-CM

## 2023-04-04 NOTE — Progress Notes (Signed)
Follow-up Visit   Date: 04/04/2023    Janet George MRN: [id number] DOB: October 12, 1965    Janet George is a 57 y.o. right-handed Caucasian female returning to the clinic for follow-up of numbness/tingling, headaches, and lightheadedness.  The patient was accompanied to the clinic by self.   IMPRESSION/PLAN: Nonspecific paresthesias of the arms and legs.  No evidence of neuropathy on EMG which was normal.  MRI brain shows mild scattered white matter changes which are not typical for MS.  To be complete, I will order CSF testing to evaluate for inflammatory changes.   Episodic headache, stable and responsive to NSAIDs. MRA head was personally viewed with patient and shows very small 1-54mm outpouching of the supraclinoid left ICA, this would not cause any of her current symptoms.   --------------------------------------------- History of present illness: For the past year, she has numbness/tingling involving the arms, hands, lower legs, and feet.  It occurs daily, lasting anywhere for 30-minutes to several hours.  No specific triggers.  It occurs more when she lays down.  She had a pedicure 6 weeks ago and felt that the pressure applied to her legs was painful.She was given Cymbalta 30mg /d for symptoms, but did not notice any change, so stopped it.    She reports having daily headaches for the past 6 months.  Pain is bifrontal, described as achy.  Pain lasts all day.  She takes ibuprofen 2-3 times per week.  Headache are not worse with sneezing, coughing, or bearing down.  She reports having a few migraines in the past few months which is associated nausea/vomiting.     She also complains of lightheadedness since January 2024.  She was found to have iron supplementation and after starting this her fatigue has improved, but no change to other symptoms. .     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  She does not smoke.  She drinks occasionally on the weekend  (1-2 glasses of wine).     In January 2023, she had an ear infection and was started on antibiotics.  She has fluid behind her ear, followed by vertigo and tinnitus.  She was seen by ENT who did not have any treatment recommendations.  Since this time, she has developed various symptoms as mentioned above.    UPDATE 04/04/2023:  She is here for follow-up visit to discuss EMG, MRI/A brain, and MRA neck.  She reports still having fatigue, numbness of the arms, and pain in the legs which is getting worse.  Headaches are less intense, occurring 2-3 times per week, which tends to be responsive to ibuprofen.  She is frustrated at the lack of diagnosis.    Medications:  Current Outpatient Medications on File Prior to Visit  Medication Sig Dispense Refill   Polysaccharide Iron Complex 434.8 (200 Fe) MG CAPS Take 1 capsule by mouth 2 (two) times daily. 180 capsule 1   nortriptyline (PAMELOR) 10 MG capsule Take 10mg  at bedtime for 2 week, then increase to 2 tablet at bedtime (Patient not taking: Reported on 04/04/2023) 60 capsule 3   No current facility-administered medications on file prior to visit.    Allergies: No Known Allergies  Vital Signs:  BP (!) 125/90   Pulse 91   Ht 5\' 5"  (1.651 m)   Wt 207 lb (93.9 kg)   SpO2 95%   BMI 34.45 kg/m   Neurological Exam: MENTAL STATUS including orientation to time, place, person, recent and remote  memory, attention span and concentration, language, and fund of knowledge is normal.  Speech is not dysarthric.  CRANIAL NERVES:    Pupils equal round and reactive to light.  Normal conjugate, extra-ocular eye movements in all directions of gaze.  No ptosis.  Face is symmetric. Palate elevates symmetrically.  Tongue is midline.  MOTOR:  Motor strength is 5/5 in all extremities.  No atrophy, fasciculations or abnormal movements.  No pronator drift.  Tone is normal.    MSRs:  Reflexes are 2+/4 throughout.  SENSORY:  Intact to vibration  throughout.  COORDINATION/GAIT:  Normal finger-to- nose-finger.  Intact rapid alternating movements bilaterally.  Gait narrow based and stable.  Stressed and tandem gait intact.   Data: NCS/EMG of the right arm and leg 02/03/2023: This is a normal study of the right upper and lower extremities.  In particular, there is no evidence of a large fiber sensorimotor polyneuropathy, cervical/lumbosacral radiculopathy, or carpal tunnel syndrome.   MRI brain wwo contrast 03/21/2023: 1.  No evidence of an acute intracranial abnormality. 2. Multifocal T2 FLAIR hyperintense signal abnormality within the cerebral white matter, overall mild but greater than expected for age. These signal changes are nonspecific and differential considerations include chronic small ischemic disease, sequelae of chronic migraine headaches, sequelae of a prior infectious/inflammatory process and sequelae of demyelinating disease, among others. 3. Mild right cerebellar tonsillar ectopia.   MRA head and neck 03/18/2023: 1. No intracranial large vessel occlusion or proximal high-grade arterial stenosis. 2. 1-2 mm inferiorly projecting vascular protrusion arising from the supraclinoid left internal carotid artery, which may reflect an aneurysm or infundibulum.     Thank you for allowing me to participate in patient's care.  If I can answer any additional questions, I would be pleased to do so.    Sincerely,    Veron Senner K. Allena Katz, DO

## 2023-04-04 NOTE — Patient Instructions (Signed)
We will order lumbar puncture

## 2023-04-06 ENCOUNTER — Telehealth: Payer: Self-pay

## 2023-04-06 DIAGNOSIS — R202 Paresthesia of skin: Secondary | ICD-10-CM

## 2023-04-06 DIAGNOSIS — R42 Dizziness and giddiness: Secondary | ICD-10-CM

## 2023-04-06 DIAGNOSIS — R519 Headache, unspecified: Secondary | ICD-10-CM

## 2023-04-06 NOTE — Telephone Encounter (Signed)
-----   Message from Glendale Chard, DO sent at 04/04/2023  2:00 PM EDT ----- Please order lumbar puncture : CSF cell count and diff, protein, glucose, IgG index, oligoclonal bands, myelin basic protein.  Pls request them to call her after July 2nd.  Thank you.

## 2023-04-20 ENCOUNTER — Ambulatory Visit
Admission: RE | Admit: 2023-04-20 | Discharge: 2023-04-20 | Disposition: A | Discharge status: Home / Self Care | Payer: BC Managed Care – PPO | Source: Ambulatory Visit | Attending: Neurology | Admitting: Neurology

## 2023-04-20 VITALS — BP 124/77 | HR 71

## 2023-04-20 DIAGNOSIS — R202 Paresthesia of skin: Secondary | ICD-10-CM

## 2023-04-20 DIAGNOSIS — R42 Dizziness and giddiness: Secondary | ICD-10-CM | POA: Diagnosis not present

## 2023-04-20 DIAGNOSIS — R519 Headache, unspecified: Secondary | ICD-10-CM

## 2023-04-20 NOTE — Discharge Instructions (Signed)

## 2023-04-20 NOTE — Progress Notes (Signed)
1 vial of blood drawn from pts R hand to be sent off with LP lab work. 1 successful attempt, pt tolerated well. Gauze and tape applied after.

## 2023-04-24 LAB — PROTEIN, CSF: Total Protein, CSF: 38 mg/dL (ref 15–45)

## 2023-04-24 LAB — CNS IGG SYNTHESIS RATE, CSF+BLOOD
Albumin Serum: 4.7 g/dL (ref 3.6–5.1)
Albumin, CSF: 20.8 mg/dL (ref 8.0–42.0)
CNS-IgG Synthesis Rate: -0.7 mg/24 h (ref <=3.3)
IgG (Immunoglobin G), Serum: 1070 mg/dL (ref 600–1640)
IgG Total CSF: 2.8 mg/dL (ref 0.8–7.7)
IgG-Index: 0.59 (ref <0.70)

## 2023-04-24 LAB — CSF CELL COUNT WITH DIFFERENTIAL
RBC Count, CSF: 3 cells/uL — ABNORMAL HIGH
TOTAL NUCLEATED CELL: 1 cells/uL (ref 0–5)

## 2023-04-24 LAB — MYELIN BASIC PROTEIN, CSF: Myelin Basic Protein: <2 mcg/L (ref <=4.0)

## 2023-04-24 LAB — OLIGOCLONAL BANDS, CSF + SERM: Oligo Bands: ABSENT

## 2023-04-24 LAB — GLUCOSE, CSF: Glucose, CSF: 58 mg/dL (ref 40–80)

## 2023-04-25 DIAGNOSIS — L820 Inflamed seborrheic keratosis: Secondary | ICD-10-CM | POA: Diagnosis not present

## 2023-04-25 DIAGNOSIS — L570 Actinic keratosis: Secondary | ICD-10-CM | POA: Diagnosis not present

## 2023-04-25 DIAGNOSIS — D485 Neoplasm of uncertain behavior of skin: Secondary | ICD-10-CM | POA: Diagnosis not present

## 2023-04-25 DIAGNOSIS — L538 Other specified erythematous conditions: Secondary | ICD-10-CM | POA: Diagnosis not present

## 2023-05-10 DIAGNOSIS — K219 Gastro-esophageal reflux disease without esophagitis: Secondary | ICD-10-CM | POA: Diagnosis not present

## 2023-05-10 DIAGNOSIS — D509 Iron deficiency anemia, unspecified: Secondary | ICD-10-CM | POA: Diagnosis not present

## 2023-05-10 DIAGNOSIS — R1310 Dysphagia, unspecified: Secondary | ICD-10-CM | POA: Diagnosis not present

## 2023-05-30 DIAGNOSIS — R1310 Dysphagia, unspecified: Secondary | ICD-10-CM | POA: Diagnosis not present

## 2023-05-30 DIAGNOSIS — K219 Gastro-esophageal reflux disease without esophagitis: Secondary | ICD-10-CM | POA: Diagnosis not present

## 2023-05-30 DIAGNOSIS — K222 Esophageal obstruction: Secondary | ICD-10-CM | POA: Diagnosis not present

## 2023-05-30 DIAGNOSIS — K2100 Gastro-esophageal reflux disease with esophagitis, without bleeding: Secondary | ICD-10-CM | POA: Diagnosis not present

## 2023-06-22 IMAGING — MG DIGITAL DIAGNOSTIC BILAT W/ TOMO W/ CAD
6 of 10 series · 6 of 30 positions shown · non-contrast
Comparison: Previous exam(s).

ACR Breast Density Category a: The breast tissue is almost entirely
fatty.

CLINICAL DATA: Focal pain in the LATERAL portion of the LEFT
breast. Patient has palpable abnormality in the anterior LEFT UPPER
QUADRANT of the abdomen.

EXAM:
DIGITAL DIAGNOSTIC BILATERAL MAMMOGRAM WITH TOMOSYNTHESIS AND CAD;
ULTRASOUND LEFT BREAST LIMITED
TECHNIQUE: Bilateral digital diagnostic mammography and breast tomosynthesis
was performed. The images were evaluated with computer-aided
detection.; Targeted ultrasound examination of the left breast was
performed.

[R MLO synth-2D]
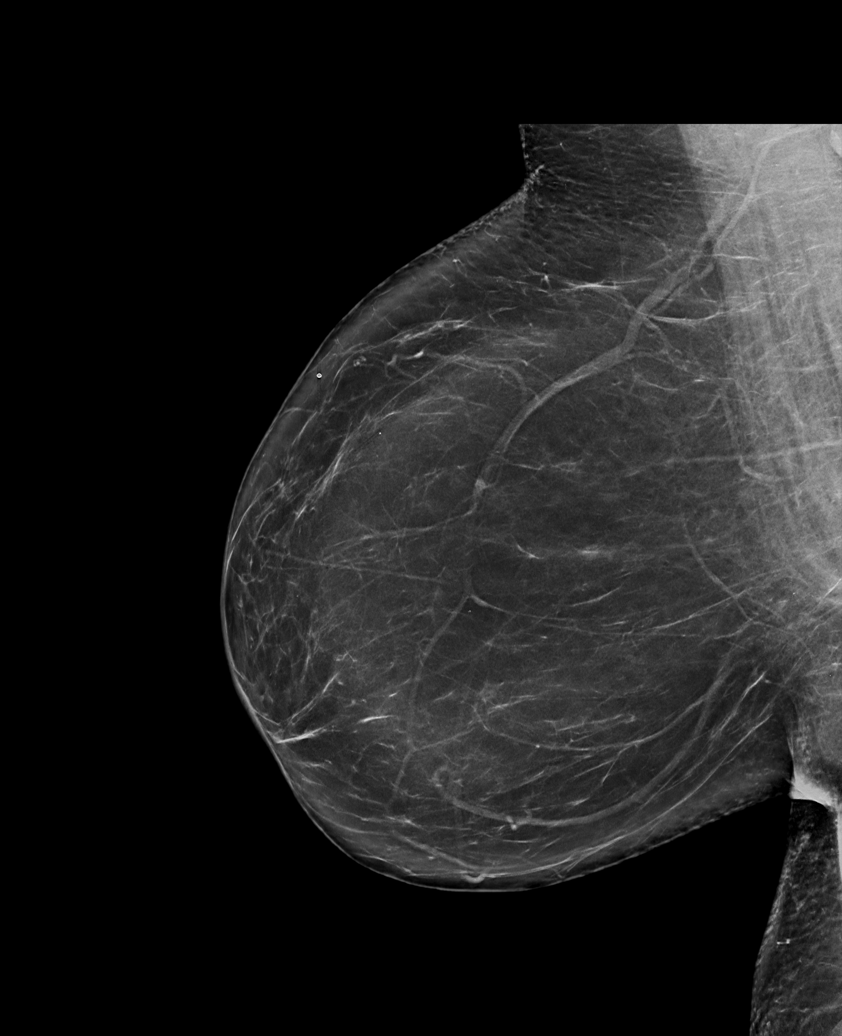

[R CC synth-2D]
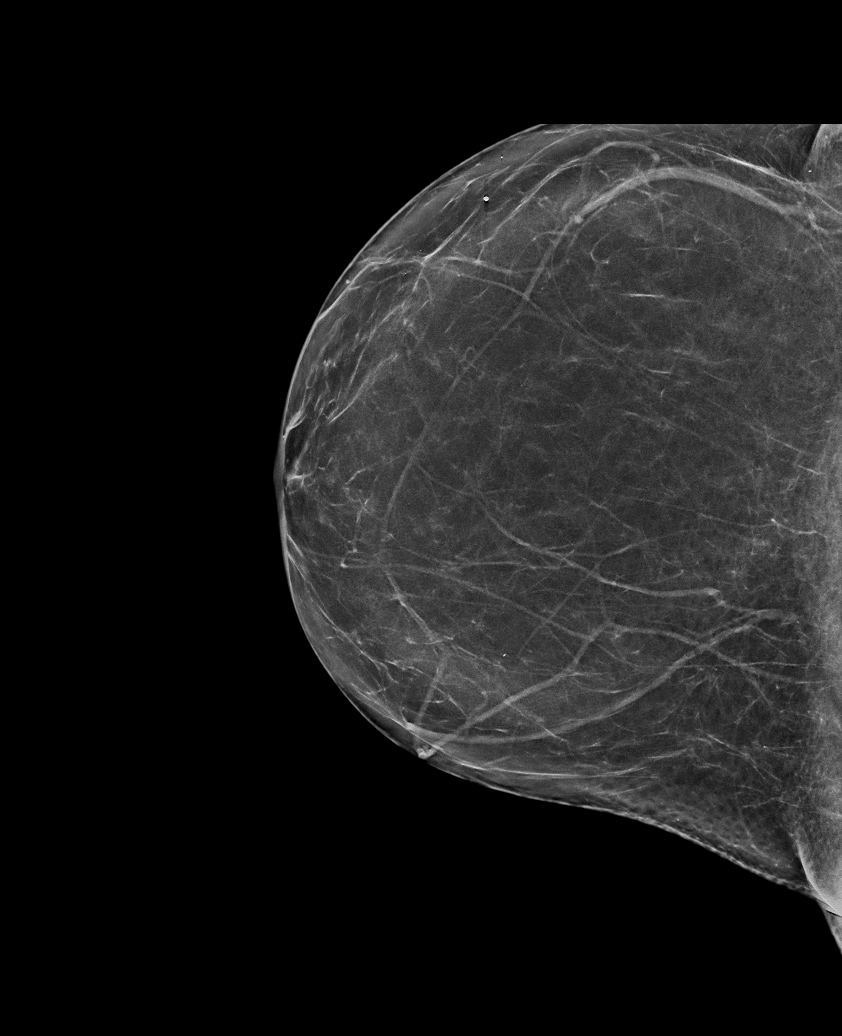

[L CC synth-2D]
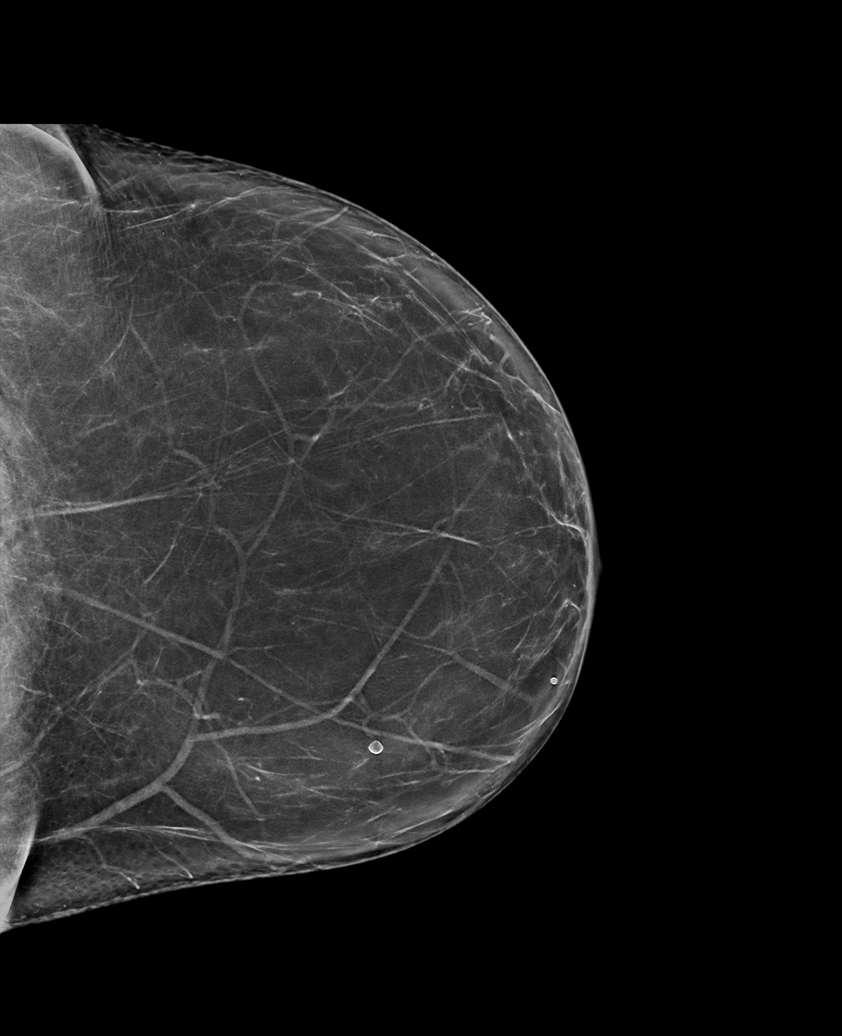

[L MLO synth-2D]
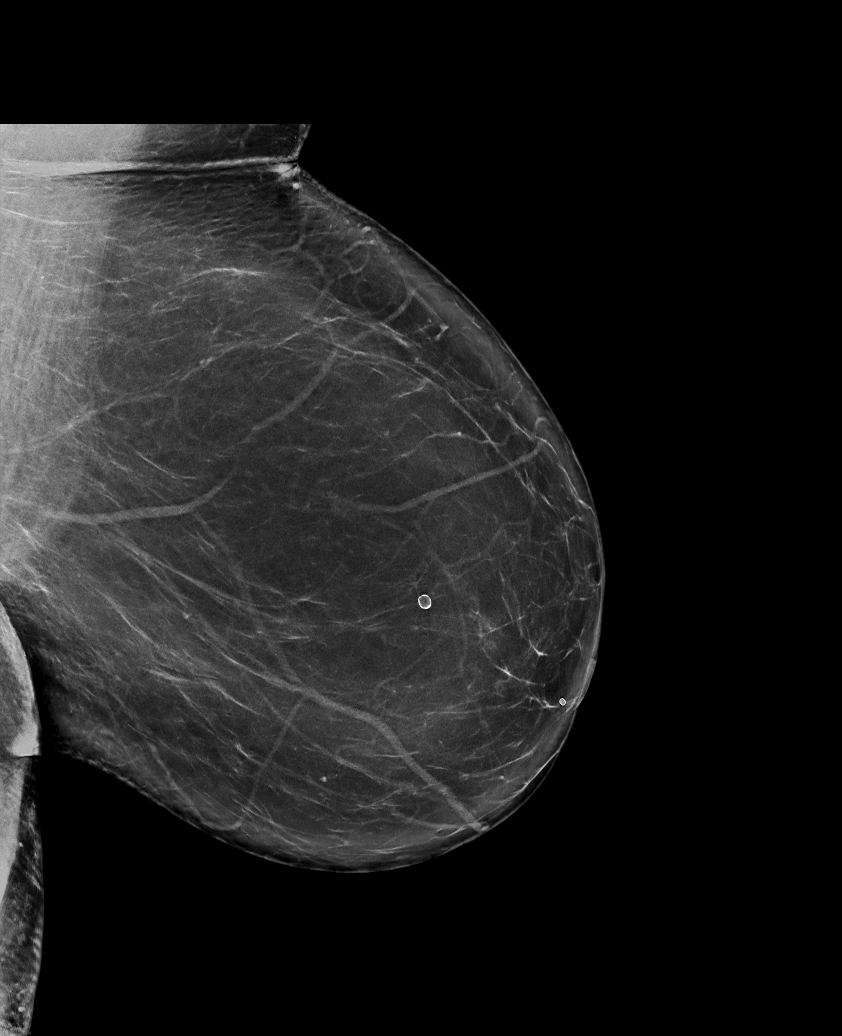

[L TAN synth-2D]
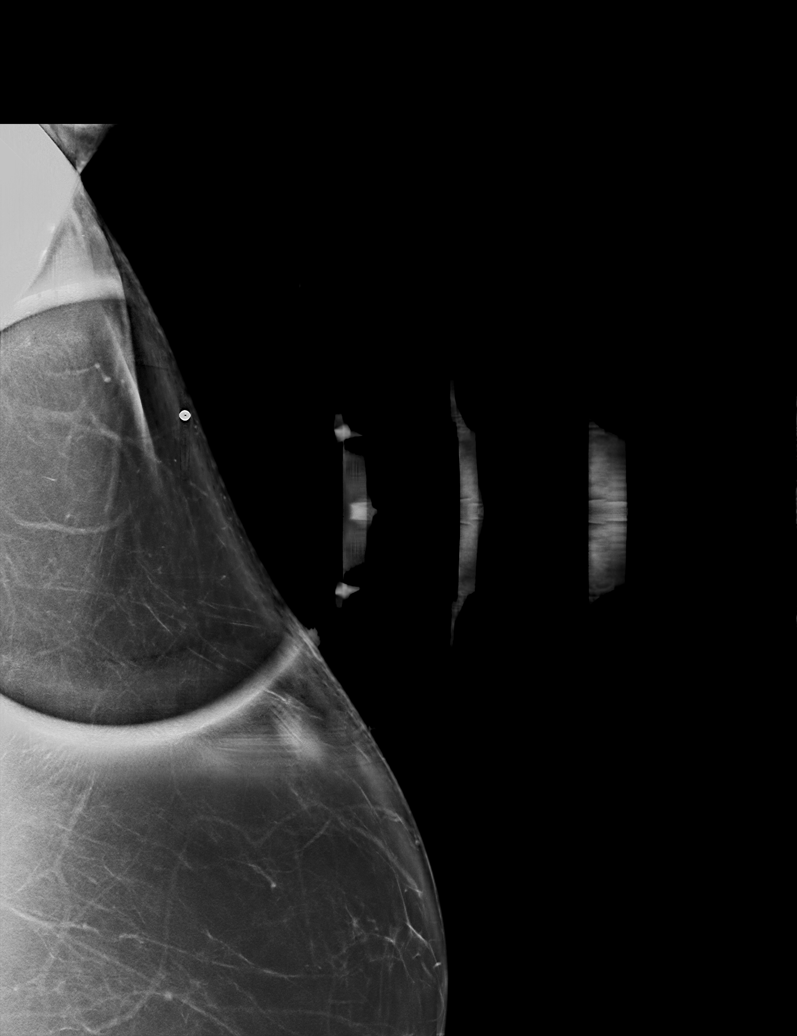

[L TAN tomo · tomo slice 45/90.0]
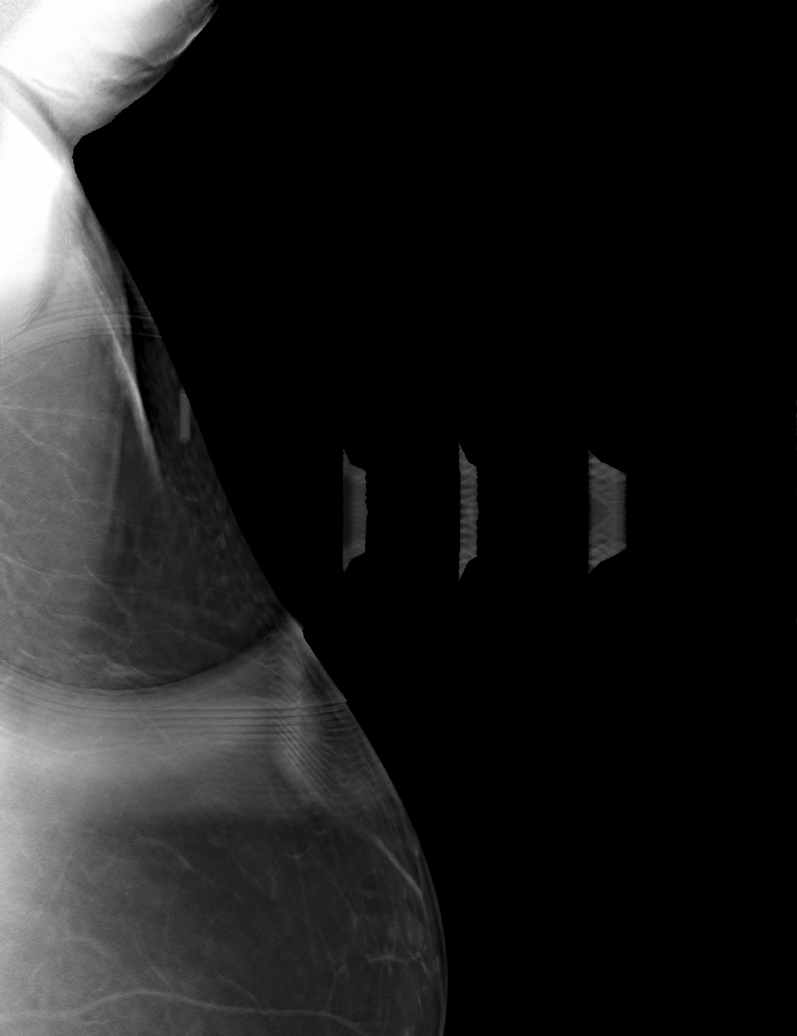

[6 of 30 positions shown; findings below may reference images not displayed]

FINDINGS: RIGHT breast is negative.

In the LEFT breast, no suspicious mass, distortion, or
microcalcifications are identified to suggest presence of
malignancy. Specifically, no abnormality identified in the LATERAL
portion of the breast to account for the patient's focal pain. The
area of concern in the anterior LEFT UPPER OUTER QUADRANT of the
abdomen is not seen mammographically.

On physical exam, I palpate a discrete soft mass in the anterior
UPPER OUTER QUADRANT of the LEFT breast, in the area the patient's
concern.

Targeted ultrasound is performed, showing normal appearing
fibroglandular tissue in the 3 o'clock location of the LEFT breast
corresponding to the area of focal pain.

In the anterior LEFT UPPER QUADRANT of the abdomen, a circumscribed
hypoechoic mass is identified within the abdominal wall musculature
and measures 2.1 x 0.7 x 2.4 centimeters. Findings are consistent
with benign lipoma.
IMPRESSION: 1.  No mammographic or ultrasound evidence for malignancy.
2. No imaging abnormality in the area of focal pain in the OUTER
portion of the LEFT breast.
3. Benign lipoma in the anterior abdominal wall of the LEFT UPPER
QUADRANT of the abdomen.

RECOMMENDATION:
Screening mammogram in one year.(Code:[id number])

I have discussed the findings and recommendations with the patient.
If applicable, a reminder letter will be sent to the patient
regarding the next appointment.

BI-RADS CATEGORY  2: Benign.

## 2023-07-03 ENCOUNTER — Encounter: Payer: Self-pay | Admitting: Family Medicine

## 2023-07-03 ENCOUNTER — Ambulatory Visit: Payer: BC Managed Care – PPO | Admitting: Family Medicine

## 2023-07-03 VITALS — BP 121/87 | HR 71 | Temp 97.6°F | Wt 200.6 lb

## 2023-07-03 DIAGNOSIS — R5382 Chronic fatigue, unspecified: Secondary | ICD-10-CM | POA: Diagnosis not present

## 2023-07-03 DIAGNOSIS — R42 Dizziness and giddiness: Secondary | ICD-10-CM

## 2023-07-03 DIAGNOSIS — E611 Iron deficiency: Secondary | ICD-10-CM | POA: Diagnosis not present

## 2023-07-03 DIAGNOSIS — R09A2 Foreign body sensation, throat: Secondary | ICD-10-CM

## 2023-07-03 DIAGNOSIS — E559 Vitamin D deficiency, unspecified: Secondary | ICD-10-CM

## 2023-07-03 DIAGNOSIS — R490 Dysphonia: Secondary | ICD-10-CM

## 2023-07-03 LAB — CBC WITH DIFFERENTIAL/PLATELET
Basophils Absolute: 0 10*3/uL (ref 0.0–0.1)
Basophils Relative: 1 % (ref 0.0–3.0)
Eosinophils Absolute: 0.3 10*3/uL (ref 0.0–0.7)
Eosinophils Relative: 6.1 % — ABNORMAL HIGH (ref 0.0–5.0)
HCT: 46.6 % — ABNORMAL HIGH (ref 36.0–46.0)
Hemoglobin: 14.9 g/dL (ref 12.0–15.0)
Lymphocytes Relative: 31.1 % (ref 12.0–46.0)
Lymphs Abs: 1.3 10*3/uL (ref 0.7–4.0)
MCHC: 32 g/dL (ref 30.0–36.0)
MCV: 92.9 fl (ref 78.0–100.0)
Monocytes Absolute: 0.4 10*3/uL (ref 0.1–1.0)
Monocytes Relative: 9.5 % (ref 3.0–12.0)
Neutro Abs: 2.2 10*3/uL (ref 1.4–7.7)
Neutrophils Relative %: 52.3 % (ref 43.0–77.0)
Platelets: 238 10*3/uL (ref 150.0–400.0)
RBC: 5.02 Mil/uL (ref 3.87–5.11)
RDW: 13.7 % (ref 11.5–15.5)
WBC: 4.2 10*3/uL (ref 4.0–10.5)

## 2023-07-03 LAB — VITAMIN D 25 HYDROXY (VIT D DEFICIENCY, FRACTURES): VITD: 38.47 ng/mL (ref 30.00–100.00)

## 2023-07-03 LAB — IBC + FERRITIN
Ferritin: 19.8 ng/mL (ref 10.0–291.0)
Iron: 127 ug/dL (ref 42–145)
Saturation Ratios: 30.8 % (ref 20.0–50.0)
TIBC: 413 ug/dL (ref 250.0–450.0)
Transferrin: 295 mg/dL (ref 212.0–360.0)

## 2023-07-03 LAB — T4, FREE: Free T4: 0.95 ng/dL (ref 0.60–1.60)

## 2023-07-03 LAB — TSH: TSH: 0.98 u[IU]/mL (ref 0.35–5.50)

## 2023-07-03 NOTE — Progress Notes (Signed)
Janet George , 1966-02-28, 57 y.o., female MRN: [id number] Patient Care Team    Relationship Specialty Notifications Start End  Natalia Leatherwood, DO PCP - General Family Medicine  11/02/22   Jill Side, OD Consulting Physician Optometry  11/02/22   Richardean Chimera, MD Consulting Physician Obstetrics and Gynecology  11/02/22   Charlott Rakes, MD Consulting Physician Gastroenterology  11/02/22   Glendale Chard, DO Consulting Physician Neurology  01/31/23     Chief Complaint  Patient presents with   Fatigue    Fatigue and dizziness     Subjective: Janet George is a 57 y.o. Pt presents for an OV to follow-up fatigue patient reports she is taking iron polysaccharide twice daily.  She has not seen much difference at all in her level of fatigue since starting supplement.  She denies blood loss per rectum.  She has followed with GI.  She has followed with neurology.  She reports she still needs to follow-up with ENT, she never did schedule that appointment.  Prior note: She reported she can fall asleep easily, but does not stay asleep.  She endorsed tingling in her fingertips that has been present for about a year, especially when laying down.  She noticed in the last month she has tingling in the tips of her toes.  She does not believe she snores and no apneic spells reported by her husband. On labs she was found to be iron deficient with an iron level of 31, percent saturation 7 and ferritin of 6 vitamin D was mildly low at 29, B12 midrange at 648 CBC unremarkable.  She had mildly elevated TPO at 25 with normal TSH and T4 free.Please inform patient her MMA test is normal. Her TPO/thyroid antibody test is just mildly elevated at 25.  Her thyroid panel is normal and there is no room to add any thyroid supplement with her current thyroid levels without causing abnormality.  We will continue to watch the antibodies and thyroid panel routinely.  Today patient reports she still feel  fatigued and does not feel the iron supplementation has helped. Still has dizziness and tingling in her fingertips and toes. She did see a NS over a DECADE ago and told her she would eventually need surgery, but she does not know why exactly. She did not care for that doctor and never returned.   She also complains of globus sensation and hoarseness of 1 month. She is not taking allergy medications. H/O of barrett esophagus. Not taking PPI.      07/03/2023    8:56 AM 11/02/2022    2:11 PM  Depression screen PHQ 2/9  Decreased Interest 0 0  Down, Depressed, Hopeless 0 0  PHQ - 2 Score 0 0    No Known Allergies Social History   Social History Narrative   Marital status/children/pets: married, G3P3   Education/employment: BS.  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 Lives with husband and son    Past Medical History:  Diagnosis Date   Arthritis    Chest pain with low risk for cardiac etiology  02/19/2016   Chicken pox    Colon polyp    Diverticulitis    Globus sensation 01/04/2017   Malignant melanoma of skin (HCC) 11/02/2022   Migraine    Skin cancer    UTI (urinary tract infection)    Past Surgical History:  Procedure Laterality Date   CESAREAN SECTION  1996   99, 06   TONSILLECTOMY AND ADENOIDECTOMY  1978   Family History  Problem Relation Age of Onset   Hearing loss Mother    Arthritis Mother    Diabetes Father    Hypertension Father    Heart disease Father    Hyperlipidemia Father    Skin cancer Father    Hearing loss Father    Diabetes Sister    Skin cancer Sister    Hypertension Brother    Breast cancer Maternal Grandmother    Hyperlipidemia Paternal Grandmother    Allergies as of 07/03/2023   No Known Allergies      Medication List         Accurate as of July 03, 2023 11:51 AM. If you have any questions, ask your nurse or doctor.          STOP taking these medications    nortriptyline 10 MG capsule Commonly known as: PAMELOR Stopped by: Felix Pacini       TAKE these medications    Polysaccharide Iron Complex 434.8 (200 Fe) MG Caps Take 1 capsule by mouth 2 (two) times daily.        All past medical history, surgical history, allergies, family history, immunizations andmedications were updated in the EMR today and reviewed under the history and medication portions of their EMR.     ROS Negative, with the exception of above mentioned in HPI   Objective:  BP 121/87   Pulse 71   Temp 97.6 F (36.4 C)   Wt 200 lb 9.6 oz (91 kg)   SpO2 99%   BMI 33.38 kg/m  Body mass index is 33.38 kg/m. Physical Exam Vitals and nursing note reviewed.  Constitutional:      General: She is not in acute distress.    Appearance: Normal appearance. She is normal weight. She is not ill-appearing or toxic-appearing.  HENT:     Head: Normocephalic and atraumatic.  Eyes:     General: No scleral icterus.       Right eye: No discharge.        Left eye: No discharge.     Extraocular Movements: Extraocular movements intact.     Conjunctiva/sclera: Conjunctivae normal.     Pupils: Pupils are equal, round, and reactive to light.  Cardiovascular:     Rate and Rhythm: Normal rate and regular rhythm.  Skin:    Findings: No rash.  Neurological:     Mental Status: She is alert and oriented to person, place, and time. Mental status is at baseline.     Motor: No weakness.     Coordination: Coordination normal.     Gait: Gait normal.  Psychiatric:        Mood and Affect: Mood normal.        Behavior: Behavior normal.        Thought Content: Thought content normal.        Judgment: Judgment normal.    No results found. No results found. No results found for this or any previous visit (from the past 24  hour(s)).  Assessment/Plan: [name] is a 57 y.o. female present for  OV for  Iron deficiency/Chronic fatigue - IBC + Ferritin Continue iron supplement. Cardiology referral to r/o cardiac cause of profound fatigue.   If iron levels are still low, despite oral supplementation would refer to hematology to be considered for iron transfusions.  Numbness and tingling Located in toes and fingertips. Lab work did not reveal cause. Sounds like prior h/o of cervical? Radiculopathy or stenosis. Difficult to be certain, regardless she would not return to that doctor and it has been over a decade ago.  - Ambulatory referral to Neurology placed last visit. Dr. Lucia Gaskins.  EMG was normal.  She also had MRI of the brain/neck, CSF studies which were normal.  Hoarseness/globus sensation: Should start allergy medication.  Referral to ENT placed for further eval placed last visit> placed again Referral to GI for h/o barrett's placed last visit- Dr. Bosie Clos  Reviewed expectations re: course of current medical issues. Discussed self-management of symptoms. Outlined signs and symptoms indicating need for more acute intervention. Patient verbalized understanding and all questions were answered. Patient received an After-Visit Summary.    Orders Placed This Encounter  Procedures   IBC + Ferritin   CBC with Differential/Platelet   Vitamin D (25 hydroxy)   PTH, Intact and Calcium   TSH   T4, free   Ambulatory referral to Cardiology   Ambulatory referral to ENT   No orders of the defined types were placed in this encounter.  Referral Orders         Ambulatory referral to Cardiology         Ambulatory referral to ENT        Note is dictated utilizing voice recognition software. Although note has been proof read prior to signing, occasional typographical errors still can be missed. If any questions arise, please do not hesitate to call for verification.   electronically signed by:  Felix Pacini, DO  [facility] [place]

## 2023-07-04 LAB — PTH, INTACT AND CALCIUM
Calcium: 9.7 mg/dL (ref 8.6–10.4)
PTH: 38 pg/mL (ref 16–77)

## 2023-07-10 ENCOUNTER — Encounter: Payer: Self-pay | Admitting: Family Medicine

## 2023-07-11 NOTE — Telephone Encounter (Signed)
Please inform patient I had reviewed all her labs prior to sending her the message that she received in her MyChart.  I commented on anything pertinent. Mildly elevated eosinophils is not a concern, and if anything means mild allergies.   The mildly elevated hematocrit is not a concern.

## 2023-07-21 ENCOUNTER — Encounter (INDEPENDENT_AMBULATORY_CARE_PROVIDER_SITE_OTHER): Payer: Self-pay | Admitting: Otolaryngology

## 2023-08-16 DIAGNOSIS — Z01419 Encounter for gynecological examination (general) (routine) without abnormal findings: Secondary | ICD-10-CM | POA: Diagnosis not present

## 2023-08-16 DIAGNOSIS — Z1231 Encounter for screening mammogram for malignant neoplasm of breast: Secondary | ICD-10-CM | POA: Diagnosis not present

## 2023-08-16 DIAGNOSIS — Z6834 Body mass index (BMI) 34.0-34.9, adult: Secondary | ICD-10-CM | POA: Diagnosis not present

## 2023-09-11 DIAGNOSIS — M545 Low back pain, unspecified: Secondary | ICD-10-CM | POA: Diagnosis not present

## 2023-09-11 DIAGNOSIS — M542 Cervicalgia: Secondary | ICD-10-CM | POA: Diagnosis not present

## 2023-09-11 DIAGNOSIS — M546 Pain in thoracic spine: Secondary | ICD-10-CM | POA: Diagnosis not present

## 2023-09-14 ENCOUNTER — Institutional Professional Consult (permissible substitution) (INDEPENDENT_AMBULATORY_CARE_PROVIDER_SITE_OTHER): Payer: BC Managed Care – PPO | Admitting: Otolaryngology

## 2023-10-12 ENCOUNTER — Institutional Professional Consult (permissible substitution) (INDEPENDENT_AMBULATORY_CARE_PROVIDER_SITE_OTHER): Payer: BC Managed Care – PPO | Admitting: Otolaryngology

## 2023-10-19 ENCOUNTER — Ambulatory Visit (HOSPITAL_BASED_OUTPATIENT_CLINIC_OR_DEPARTMENT_OTHER): Payer: BC Managed Care – PPO | Admitting: Cardiology

## 2023-10-19 ENCOUNTER — Encounter (HOSPITAL_BASED_OUTPATIENT_CLINIC_OR_DEPARTMENT_OTHER): Payer: Self-pay | Admitting: Cardiology

## 2023-10-19 VITALS — BP 116/82 | HR 82 | Ht 65.0 in | Wt 192.6 lb

## 2023-10-19 DIAGNOSIS — E611 Iron deficiency: Secondary | ICD-10-CM

## 2023-10-19 DIAGNOSIS — R42 Dizziness and giddiness: Secondary | ICD-10-CM | POA: Diagnosis not present

## 2023-10-19 DIAGNOSIS — R5382 Chronic fatigue, unspecified: Secondary | ICD-10-CM | POA: Diagnosis not present

## 2023-10-19 NOTE — Progress Notes (Signed)
 Cardiology Office Note:  .   Date:  10/19/2023  ID:  Janet George George, DOB [birth year], MRN [id number] PCP: Catherine Charlies LABOR, DO  [facility] Providers Cardiologist:  Shelda Bruckner, MD {  History of Present Illness: Janet George   Janet George George is a 58 y.o. female with PMH chronic fatigue, iron  deficiency anemia, obesity seen as a new patient consultation for episodic lightheadedness at the request of Dr. Catherine.  Today: Reviewed notes from Dr. Catherine. Has chronic fatigue, did not note improvement with iron  supplements. Has seen GI and neurology. Referred to cardiology for further evaluation of her fatigue.  Remotely seen by Dr. Anner in 2017 for chest pain, ETT unremarkable.  Symptoms started with an ear infection in January 2023. Started on meds but ear was still bothering her a month later. Had vertigo, then tinnitus. Saw ENT.  Also noted tingling in arms and legs, had neuro workup that rule out MS. Feels that there are points all over her body that sometimes hurt so bad that she is in extreme pain when she touches them.   Symptoms have not resolved with improvement in her iron  levels.  Notes that when she has her spells, she has a severe sensation of motion. Better if she grips something. Feels like she is drifting to one side when she is walking during this. No clear pattern--not clearly related to position, time of day, activity. They have decreased in frequency, now every few days.  Tingling and numbness are all day, every day, worse when she lays down. Has been going on for several years. Fatigue is also all day, every day. Sometimes she feels like it is hard to lift her arms. Some days are better than others, but none are near her prior baseline from two years ago.   Feels rare occasional skipped beats, several times a week, not sustained.  Sometimes feels that her joints can be swollen or tender, but no edema.   FH: dad sees a cardiologist for something, unclear what. No  MI or CVA in her family as far as she knows.  Had MRA neck, normal aortic arch, normal carotids bilaterally.  Has another ENT visit coming up for a second opinion. Has not had vestibular therapy discussed with her in the past.  ROS: Denies chest pain, shortness of breath at rest or with normal exertion. No PND, orthopnea, LE edema or unexpected weight gain. No syncope or palpitations. ROS otherwise negative except as noted.   Studies Reviewed: Janet George    EKG:  EKG Interpretation Date/Time:  Thursday October 19 2023 13:57:12 EST Ventricular Rate:  82 PR Interval:  116 QRS Duration:  110 QT Interval:  406 QTC Calculation: 474 R Axis:   92  Text Interpretation: Normal sinus rhythm Incomplete right bundle branch block When compared with ECG of 28-Jan-2016 18:46, No significant change was found Confirmed by Bruckner Shelda [phone]) on 10/19/2023 2:10:43 PM    Physical Exam:   VS:  BP 116/82 (BP Location: Right Arm, Cuff Size: Normal)   Pulse 82   Ht 5' 5 (1.651 m)   Wt 192 lb 9.6 oz (87.4 kg)   SpO2 99%   BMI 32.05 kg/m    Wt Readings from Last 3 Encounters:  10/19/23 192 lb 9.6 oz (87.4 kg)  07/03/23 200 lb 9.6 oz (91 kg)  04/04/23 207 lb (93.9 kg)    Orthostatics: Lying 112/81, HR 87 Sitting 125/84, HR 97 Standing 121/84, HR 106 Standing 3 min 116/82, HR 113  GEN: Well nourished, well developed in no acute distress HEENT: Normal, moist mucous membranes NECK: No JVD CARDIAC: regular rhythm, normal S1 and S2, no rubs or gallops. No murmur. VASCULAR: Radial and DP pulses 2+ bilaterally. No carotid bruits RESPIRATORY:  Clear to auscultation without rales, wheezing or rhonchi  ABDOMEN: Soft, non-tender, non-distended MUSCULOSKELETAL:  Ambulates independently SKIN: Warm and dry, no edema NEUROLOGIC:  Alert and oriented x 3. No focal neuro deficits noted. PSYCHIATRIC:  Normal affect    ASSESSMENT AND PLAN: .    Chronic fatigue Vertigo/lightheadedness Limb  tingling -pulses are excellent. MRA head/neck without any significant stenosis -orthostatics negative -fatigue is constant, not exertional. Not consistent with ischemia. No symptoms/signs of volume overload -unremarkable ECG -given the above, no further cardiac workup at this time. Instructed to call with new/concerning symptoms -vertigo, tinnitus pending ENT evaluation. Recommend she ask about vestibular rehab -iron  deficiency treated, no significant change in symptoms  CV risk counseling and prevention -recommend heart healthy/Mediterranean diet, with whole grains, fruits, vegetable, fish, lean meats, nuts, and olive oil. Limit salt. -recommend moderate walking, 3-5 times/week for 30-50 minutes each session. Aim for at least 150 minutes.week. Goal should be pace of 3 miles/hours, or walking 1.5 miles in 30 minutes -recommend avoidance of tobacco products. Avoid excess alcohol.  Dispo: as needed  Signed, Shelda Bruckner, MD   Shelda Bruckner, MD, PhD, [facility] [place]  [facility]  [place]  [facility] at [facility] [address], [address] [place], [place] [address] [phone]

## 2023-10-19 NOTE — Patient Instructions (Signed)
 Medication Instructions:  Your physician recommends that you continue on your current medications as directed. Please refer to the Current Medication list given to you today.  *If you need a refill on your cardiac medications before your next appointment, please call your pharmacy*  Lab Work: NONE  Testing/Procedures: NONE  Follow-Up: At [facility], you and your health needs are our priority.  As part of our continuing mission to provide you with exceptional heart care, we have created designated Provider Care Teams.  These Care Teams include your primary Cardiologist (physician) and Advanced Practice Providers (APPs -  Physician Assistants and Nurse Practitioners) who all work together to provide you with the care you need, when you need it.  We recommend signing up for the patient portal called "MyChart".  Sign up information is provided on this After Visit Summary.  MyChart is used to connect with patients for Virtual Visits (Telemedicine).  Patients are able to view lab/test results, encounter notes, upcoming appointments, etc.  Non-urgent messages can be sent to your provider as well.   To learn more about what you can do with MyChart, go to [email or web address].    Your next appointment:   Follow up as needed  Provider:   Jodelle Red, MD

## 2023-10-30 DIAGNOSIS — M546 Pain in thoracic spine: Secondary | ICD-10-CM | POA: Diagnosis not present

## 2023-10-30 DIAGNOSIS — M542 Cervicalgia: Secondary | ICD-10-CM | POA: Diagnosis not present

## 2023-11-02 ENCOUNTER — Encounter (INDEPENDENT_AMBULATORY_CARE_PROVIDER_SITE_OTHER): Payer: Self-pay | Admitting: Otolaryngology

## 2023-11-02 ENCOUNTER — Ambulatory Visit (INDEPENDENT_AMBULATORY_CARE_PROVIDER_SITE_OTHER): Payer: BC Managed Care – PPO | Admitting: Otolaryngology

## 2023-11-02 VITALS — BP 140/86 | HR 88 | Ht 60.0 in | Wt 185.0 lb

## 2023-11-02 DIAGNOSIS — J3089 Other allergic rhinitis: Secondary | ICD-10-CM | POA: Diagnosis not present

## 2023-11-02 DIAGNOSIS — K219 Gastro-esophageal reflux disease without esophagitis: Secondary | ICD-10-CM

## 2023-11-02 DIAGNOSIS — H903 Sensorineural hearing loss, bilateral: Secondary | ICD-10-CM

## 2023-11-02 DIAGNOSIS — R0982 Postnasal drip: Secondary | ICD-10-CM

## 2023-11-02 DIAGNOSIS — R0981 Nasal congestion: Secondary | ICD-10-CM

## 2023-11-02 DIAGNOSIS — R490 Dysphonia: Secondary | ICD-10-CM

## 2023-11-02 DIAGNOSIS — H9311 Tinnitus, right ear: Secondary | ICD-10-CM | POA: Diagnosis not present

## 2023-11-02 DIAGNOSIS — R09A2 Foreign body sensation, throat: Secondary | ICD-10-CM | POA: Diagnosis not present

## 2023-11-02 MED ORDER — CETIRIZINE HCL 10 MG PO TABS: 10.0000 mg | ORAL_TABLET | Freq: Every day | ORAL | 11 refills | Status: AC

## 2023-11-02 MED ORDER — FLUTICASONE PROPIONATE 50 MCG/ACT NA SUSP: 2.0000 | Freq: Two times a day (BID) | NASAL | 6 refills | Status: AC

## 2023-11-02 MED ORDER — FAMOTIDINE 20 MG PO TABS: 20.0000 mg | ORAL_TABLET | Freq: Two times a day (BID) | ORAL | 3 refills | Status: AC

## 2023-11-02 NOTE — Patient Instructions (Signed)

## 2023-11-02 NOTE — Progress Notes (Signed)
ENT CONSULT:  Reason for Consult: dysphonia and globus sensation    HPI: Discussed the use of AI scribe software for clinical note transcription with the patient, who gave verbal consent to proceed.  History of Present Illness   The patient is a 87 yoF who presents with a chief complaint of a sensation of something in their throat and a gravelly voice, which they noticed approximately eight months ago. The patient denies any precipitating events such as illness or surgery. The voice change is described as consistent, neither improving nor worsening over time. Accompanying this, the patient reports a globus sensation and occasional throat clearing. They deny any consistent postnasal drainage or allergies.  In addition to the throat discomfort, the patient has been experiencing tinnitus in the right ear since an ear infection in January 2023. The tinnitus is constant and has not resolved since its onset. They have had a hearing test within the last year, which showed a slight decrease in hearing in the right ear. The patient also mentions a sensation of muffled hearing in the right ear.  The patient has been on medication for reflux, taking omeprazole inconsistently, and does not report typical heartburn symptoms. They have also had an endoscopy/EGD, which was reported as normal. The patient denies any history of smoking or substantial alcohol use. They have seen an ENT for the ear infection and tinnitus, but no significant interventions were made at that time.      Records Reviewed:   06/24/22 [facility] PA note  Ms. Anton presents today complaint of recurrent ear infection and tinnitus in the right ear. She gives a history of having had several infections and fluid back in the winter and spring of this year. She does not feel like she has an infection at this point but still notes tinnitus. She states she did have swimmer's ear quite a bit as a child but never had to have any type of surgery.  There is no history of long-term loud noise exposure. She states her ears do itch quite a bit and she admits to using Q-tips.   Audiological evaluation: Audiometry: The right ear shows normal hearing with mild loss at 2000 Hz and 4000 through 8000 Hz. The left ear shows normal hearing with mild loss from 4000 Hz through 6000 Hz. SRT's: The right ear shows 20 dB HL. The left ear shows 10 dB HL. Word recognition testing: Right ear shows 92% at 60 dB HL with 40 dB EM. The left ear shows 88% at 50 dB HL with 30 dB EM. Tympanometry: Type A tympanograms in both ears.   1) sensorineural hearing loss both ears 2) Tinnitus right ear  Hearing protection discussed Masking techniques discussed  Return to clinic 1 year with audio first or as needed if any changes noted or she feels like she has an acute infection      Past Medical History:  Diagnosis Date   Arthritis    Chest pain with low risk for cardiac etiology 02/19/2016   Chicken pox    Colon polyp    Diverticulitis    Globus sensation 01/04/2017   Malignant melanoma of skin (HCC) 11/02/2022   Migraine    Skin cancer    UTI (urinary tract infection)     Past Surgical History:  Procedure Laterality Date   CESAREAN SECTION  1996   99, 06   TONSILLECTOMY AND ADENOIDECTOMY  1978    Family History  Problem Relation Age of Onset   Hearing  loss Mother    Arthritis Mother    Diabetes Father    Hypertension Father    Heart disease Father    Hyperlipidemia Father    Skin cancer Father    Hearing loss Father    Diabetes Sister    Skin cancer Sister    Hypertension Brother    Breast cancer Maternal Grandmother    Hyperlipidemia Paternal Grandmother     Social History:  reports that she quit smoking about 37 years ago. Her smoking use included cigarettes. She has never been exposed to tobacco smoke. She does not have any smokeless tobacco history on file. She reports current alcohol use. She reports that she does not use  drugs.  Allergies: No Known Allergies  Medications: I have reviewed the patient's current medications.  The PMH, PSH, Medications, Allergies, and SH were reviewed and updated.  ROS: Constitutional: Negative for fever, weight loss and weight gain. Cardiovascular: Negative for chest pain and dyspnea on exertion. Respiratory: Is not experiencing shortness of breath at rest. Gastrointestinal: Negative for nausea and vomiting. Neurological: Negative for headaches. Psychiatric: The patient is not nervous/anxious  Blood pressure (!) 140/86, pulse 88, height 5' (1.524 m), weight 185 lb (83.9 kg), SpO2 99%.  PHYSICAL EXAM:  Exam: General: Well-developed, well-nourished Communication and Voice: Clear pitch and clarity Respiratory Respiratory effort: Equal inspiration and expiration without stridor Cardiovascular Peripheral Vascular: Warm extremities with equal color/perfusion Eyes: No nystagmus with equal extraocular motion bilaterally Neuro/Psych/Balance: Patient oriented to person, place, and time; Appropriate mood and affect; Gait is intact with no imbalance; Cranial nerves I-XII are intact Head and Face Inspection: Normocephalic and atraumatic without mass or lesion Palpation: Facial skeleton intact without bony stepoffs Salivary Glands: No mass or tenderness Facial Strength: Facial motility symmetric and full bilaterally ENT Pinna: External ear intact and fully developed External canal: Canal is patent with intact skin Tympanic Membrane: Clear and mobile External Nose: No scar or anatomic deformity Internal Nose: Septum is deviated to the left. No polyp, or purulence. Mucosal edema and erythema present.  Bilateral inferior turbinate hypertrophy.  Lips, Teeth, and gums: Mucosa and teeth intact and viable TMJ: No pain to palpation with full mobility Oral cavity/oropharynx: No erythema or exudate, no lesions present Nasopharynx: No mass or lesion with intact mucosa Hypopharynx:  Intact mucosa without pooling of secretions Larynx Glottic: Full true vocal cord mobility without lesion or mass Supraglottic: Normal appearing epiglottis and AE folds Interarytenoid Space: Moderate pachydermia&edema Subglottic Space: Patent without lesion or edema Neck Neck and Trachea: Midline trachea without mass or lesion Thyroid: No mass or nodularity Lymphatics: No lymphadenopathy  Procedure: Preoperative diagnosis: hoarseness and globus sensation   Postoperative diagnosis:   Same  Procedure: Flexible fiberoptic laryngoscopy  Surgeon: Ashok Croon, MD  Anesthesia: Topical lidocaine and Afrin Complications: None Condition is stable throughout exam  Indications and consent:  The patient presents to the clinic with Indirect laryngoscopy view was incomplete. Thus it was recommended that they undergo a flexible fiberoptic laryngoscopy. All of the risks, benefits, and potential complications were reviewed with the patient preoperatively and verbal informed consent was obtained.  Procedure: The patient was seated upright in the clinic. Topical lidocaine and Afrin were applied to the nasal cavity. After adequate anesthesia had occurred, I then proceeded to pass the flexible telescope into the nasal cavity. The nasal cavity was patent without rhinorrhea or polyp. The nasopharynx was also patent without mass or lesion. The base of tongue was visualized and was normal. There were no  signs of pooling of secretions in the piriform sinuses. The true vocal folds were mobile bilaterally. There were no signs of glottic or supraglottic mucosal lesion or mass. There was moderate interarytenoid pachydermia and post cricoid edema. The telescope was then slowly withdrawn and the patient tolerated the procedure throughout.    PROCEDURE NOTE: nasal endoscopy  Preoperative diagnosis: chronic nasal congestion symptoms  Postoperative diagnosis: same  Procedure: Diagnostic nasal endoscopy  (14782)  Surgeon: Ashok Croon, M.D.  Anesthesia: Topical lidocaine and Afrin  H&P REVIEW: The patient's history and physical were reviewed today prior to procedure. All medications were reviewed and updated as well. Complications: None Condition is stable throughout exam Indications and consent: The patient presents with symptoms of chronic sinusitis not responding to previous therapies. All the risks, benefits, and potential complications were reviewed with the patient preoperatively and informed consent was obtained. The time out was completed with confirmation of the correct procedure.   Procedure: The patient was seated upright in the clinic. Topical lidocaine and Afrin were applied to the nasal cavity. After adequate anesthesia had occurred, the rigid nasal endoscope was passed into the nasal cavity. The nasal mucosa, turbinates, septum, and sinus drainage pathways were visualized bilaterally. This revealed no purulence or significant secretions that might be cultured. There were no polyps or sites of significant inflammation. The mucosa was intact and there was no crusting present. The scope was then slowly withdrawn and the patient tolerated the procedure well. There were no complications or blood loss.   Studies Reviewed:   Audiogram report from [facility] 06/24/22 Audiological evaluation: Audiometry: The right ear shows normal hearing with mild loss at 2000 Hz and 4000 through 8000 Hz. The left ear shows normal hearing with mild loss from 4000 Hz through 6000 Hz. SRT's: The right ear shows 20 dB HL. The left ear shows 10 dB HL. Word recognition testing: Right ear shows 92% at 60 dB HL with 40 dB EM. The left ear shows 88% at 50 dB HL with 30 dB EM. Tympanometry: Type A tympanograms in both ears.   EGD report 05/10/2023 Reviewed date:06/07/2023 08:31:21 AM Interpretation:Esophageal mucosa with no significant abnormality-no NFA:[id number] mucosa with no significant abnormality Performing  Lab: Notes/Report: Esophageal mucosa with no significant abnormality-no QIO:[id number] mucosa with no significant abnormality   Assessment/Plan: Encounter Diagnoses  Name Primary?   Dysphonia Yes   Tinnitus of right ear    Sensorineural hearing loss (SNHL) of both ears    Globus sensation    Chronic GERD    Chronic nasal congestion    Post-nasal drip [R09.82]    Environmental and seasonal allergies [J30.89]     Assessment and Plan    Chronic Voice Changes Dysphonia x 8 mo Intermittent gravelly voice for eight months. No associated illness or surgery before sx onset. Today's scope exam showed no vocal cord lesions or masses, no evidence of VF paralysis, but significant nasal congestion and postnasal drainage as well as findings c/w GERD LPR. Possible contributing factors include silent reflux and postnasal drainage. Explained that silent reflux can cause voice changes and globus sensation without heartburn. Discussed benefits of Pepcid and Reflux Gourmet for managing reflux and potential need for voice therapy if symptoms persist. - Start Pepcid 20 mg twice daily - Use Reflux Gourmet after meals - Provided dietary and lifestyle modification information for reflux management - Consider voice therapy if no improvement  Globus Sensation and GERD LPR  Sensation of a lump in the throat, likely related to silent reflux. No  significant findings on scope exam. Explained that silent reflux can cause globus sensation and discussed benefits of Pepcid and Reflux Gourmet for managing reflux. She had normal EGD 05/10/23 based on record review, no evidence of EoE - Start Pepcid 20 mg twice daily - Use Reflux Gourmet after meals - Provided dietary and lifestyle modification information for reflux management  Suspected eustachian tube dysfunction and Nasal congestion/Environmental allergies. Nasal endoscopy without polyps or pus, but evidence of septal deviation/ITH and mucosal edema/post-nasal  drainage Ear infection in January 2023 with subsequent tinnitus R side and muffled hearing on the right. Normal ear exam today. Likely related to previous ear infection and nasal congestion. Discussed benefits of Flonase and Zyrtec for managing nasal congestion and postnasal drainage. 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 Previous hearing test showed mild high-frequency hearing loss b/l per report, no audiogram available for review (done at [facility]) No middle ear fluid or significant findings on today's ear exam.  Discussed importance of monitoring symptoms and using hearing protection/avoiding noise exposure. - Monitor symptoms - Annual audiogram  General Health Maintenance No history of smoking or substantial alcohol use. No consistent allergies. Currently experiencing a mild cold. Discussed dietary and lifestyle modifications for reflux management, including elevating the head of the bed and limiting caffeine, spicy foods, chocolate, and wine. - Provide information on dietary and lifestyle modifications for reflux management - Recommend sleeping with the head of the bed elevated - Limit caffeine, spicy foods, chocolate, and wine  Follow-up - Schedule follow-up appointment in a few weeks.     Thank you for allowing me to participate in the care of this patient. Please do not hesitate to contact me with any questions or concerns.   Ashok Croon, MD Otolaryngology [facility] Phone: [phone] Fax: [phone]    11/04/2023, 5:11 AM

## 2023-11-10 DIAGNOSIS — M5412 Radiculopathy, cervical region: Secondary | ICD-10-CM | POA: Diagnosis not present

## 2023-11-13 DIAGNOSIS — M5412 Radiculopathy, cervical region: Secondary | ICD-10-CM | POA: Diagnosis not present

## 2023-12-05 DIAGNOSIS — M5412 Radiculopathy, cervical region: Secondary | ICD-10-CM | POA: Diagnosis not present

## 2023-12-14 DIAGNOSIS — L821 Other seborrheic keratosis: Secondary | ICD-10-CM | POA: Diagnosis not present

## 2023-12-14 DIAGNOSIS — D225 Melanocytic nevi of trunk: Secondary | ICD-10-CM | POA: Diagnosis not present

## 2023-12-14 DIAGNOSIS — L814 Other melanin hyperpigmentation: Secondary | ICD-10-CM | POA: Diagnosis not present

## 2023-12-14 DIAGNOSIS — L578 Other skin changes due to chronic exposure to nonionizing radiation: Secondary | ICD-10-CM | POA: Diagnosis not present

## 2023-12-19 DIAGNOSIS — M542 Cervicalgia: Secondary | ICD-10-CM | POA: Diagnosis not present

## 2024-09-10 DIAGNOSIS — Z01419 Encounter for gynecological examination (general) (routine) without abnormal findings: Secondary | ICD-10-CM | POA: Diagnosis not present

## 2024-09-10 DIAGNOSIS — Z6828 Body mass index (BMI) 28.0-28.9, adult: Secondary | ICD-10-CM | POA: Diagnosis not present

## 2024-09-10 DIAGNOSIS — Z1231 Encounter for screening mammogram for malignant neoplasm of breast: Secondary | ICD-10-CM | POA: Diagnosis not present

## 2024-09-17 ENCOUNTER — Other Ambulatory Visit: Payer: Self-pay | Admitting: Obstetrics and Gynecology

## 2024-09-17 DIAGNOSIS — R928 Other abnormal and inconclusive findings on diagnostic imaging of breast: Secondary | ICD-10-CM

## 2024-09-19 DIAGNOSIS — Z1389 Encounter for screening for other disorder: Secondary | ICD-10-CM | POA: Diagnosis not present

## 2024-09-19 DIAGNOSIS — Z13228 Encounter for screening for other metabolic disorders: Secondary | ICD-10-CM | POA: Diagnosis not present

## 2024-09-19 DIAGNOSIS — Z131 Encounter for screening for diabetes mellitus: Secondary | ICD-10-CM | POA: Diagnosis not present

## 2024-09-19 DIAGNOSIS — Z1329 Encounter for screening for other suspected endocrine disorder: Secondary | ICD-10-CM | POA: Diagnosis not present

## 2024-09-19 DIAGNOSIS — Z13220 Encounter for screening for lipoid disorders: Secondary | ICD-10-CM | POA: Diagnosis not present

## 2024-09-21 ENCOUNTER — Inpatient Hospital Stay
Admission: RE | Admit: 2024-09-21 | Discharge: 2024-09-21 | Attending: Obstetrics and Gynecology | Admitting: Obstetrics and Gynecology

## 2024-09-21 ENCOUNTER — Other Ambulatory Visit: Payer: Self-pay | Admitting: Obstetrics and Gynecology

## 2024-09-21 DIAGNOSIS — R928 Other abnormal and inconclusive findings on diagnostic imaging of breast: Secondary | ICD-10-CM

## 2024-09-21 DIAGNOSIS — N6311 Unspecified lump in the right breast, upper outer quadrant: Secondary | ICD-10-CM

## 2024-10-04 ENCOUNTER — Encounter

## 2024-10-04 ENCOUNTER — Other Ambulatory Visit

## 2025-04-01 ENCOUNTER — Other Ambulatory Visit
